# Patient Record
Sex: Male | Born: 1935 | Race: White | Hispanic: No | Marital: Single | Smoking: Never smoker
Health system: Southern US, Community
[De-identification: ages and names within clinical notes are randomized; demographics above are authoritative.]

## PROBLEM LIST (undated history)

## (undated) ENCOUNTER — Emergency Department (HOSPITAL_BASED_OUTPATIENT_CLINIC_OR_DEPARTMENT_OTHER): Admission: EM | Payer: Self-pay | Source: Home / Self Care

## (undated) DIAGNOSIS — I341 Nonrheumatic mitral (valve) prolapse: Secondary | ICD-10-CM

## (undated) DIAGNOSIS — Z923 Personal history of irradiation: Secondary | ICD-10-CM

## (undated) DIAGNOSIS — C801 Malignant (primary) neoplasm, unspecified: Secondary | ICD-10-CM

## (undated) DIAGNOSIS — C61 Malignant neoplasm of prostate: Secondary | ICD-10-CM

## (undated) DIAGNOSIS — H3530 Unspecified macular degeneration: Secondary | ICD-10-CM

## (undated) DIAGNOSIS — N529 Male erectile dysfunction, unspecified: Secondary | ICD-10-CM

## (undated) DIAGNOSIS — C4490 Unspecified malignant neoplasm of skin, unspecified: Secondary | ICD-10-CM

## (undated) HISTORY — DX: Personal history of irradiation: Z92.3

## (undated) HISTORY — PX: OTHER SURGICAL HISTORY: SHX169

## (undated) HISTORY — DX: Nonrheumatic mitral (valve) prolapse: I34.1

## (undated) HISTORY — DX: Malignant (primary) neoplasm, unspecified: C80.1

## (undated) HISTORY — DX: Male erectile dysfunction, unspecified: N52.9

## (undated) HISTORY — DX: Malignant neoplasm of prostate: C61

## (undated) HISTORY — PX: CATARACT EXTRACTION: SUR2

## (undated) HISTORY — PX: TONSILLECTOMY: SUR1361

---

## 2003-06-15 ENCOUNTER — Ambulatory Visit (HOSPITAL_COMMUNITY): Admission: RE | Admit: 2003-06-15 | Discharge: 2003-06-15 | Payer: Self-pay | Admitting: *Deleted

## 2007-12-17 DIAGNOSIS — C61 Malignant neoplasm of prostate: Secondary | ICD-10-CM

## 2007-12-17 HISTORY — DX: Malignant neoplasm of prostate: C61

## 2008-04-17 DIAGNOSIS — C801 Malignant (primary) neoplasm, unspecified: Secondary | ICD-10-CM

## 2008-04-17 HISTORY — DX: Malignant (primary) neoplasm, unspecified: C80.1

## 2010-01-10 ENCOUNTER — Ambulatory Visit: Admission: RE | Admit: 2010-01-10 | Discharge: 2010-02-01 | Payer: Self-pay | Admitting: Radiation Oncology

## 2010-09-02 NOTE — Op Note (Signed)
NAME:  Lucas Navarro, Lucas Navarro                   ACCOUNT NO.:  [id number]   MEDICAL RECORD NO.:  [id number]                   PATIENT TYPE:  AMB   LOCATION:  ENDO                                 FACILITY:  [facility]   PHYSICIAN:  Georgiana Spinner, M.D.                 DATE OF BIRTH:  [birth year]   DATE OF PROCEDURE:  DATE OF DISCHARGE:                                 OPERATIVE REPORT   PROCEDURE:  Colonoscopy.   INDICATION:  Colon cancer screening.   ANESTHESIA:  Demerol 60 mg, Versed 6 mg.   DESCRIPTION OF PROCEDURE:  With the patient mildly sedated in the left  lateral decubitus position, a rectal exam was performed which was  unremarkable.  Subsequently, the Olympus videoscopic colonoscope was  inserted in the rectum and passed under direct vision to the cecum  identified by the ileocecal valve and appendiceal orifice both of which were  photographed.  From this point, the colonoscope was slowly withdrawn, taking  circumferential views of the colonic mucosa, stopping only in the rectum  which appeared normal on direct view and showed hemorrhoids on retroflex  view.  The endoscope was straightened and withdrawn.  The patient's vital  signs and pulse oximetry remained stable, the patient tolerated the  procedure well without apparent complications.   FINDINGS:  Internal hemorrhoids, otherwise unremarkable exam.   PLAN:  Consider repeat examination in 5-10 years.                                               Georgiana Spinner, M.D.    GMO/MEDQ  D:  06/15/2003  T:  06/15/2003  Job:  [id number]

## 2011-09-07 DIAGNOSIS — C61 Malignant neoplasm of prostate: Secondary | ICD-10-CM

## 2011-09-07 HISTORY — DX: Malignant neoplasm of prostate: C61

## 2011-09-20 ENCOUNTER — Encounter: Payer: Self-pay | Admitting: *Deleted

## 2011-09-20 DIAGNOSIS — C801 Malignant (primary) neoplasm, unspecified: Secondary | ICD-10-CM | POA: Insufficient documentation

## 2011-09-21 ENCOUNTER — Encounter: Payer: Self-pay | Admitting: Radiation Oncology

## 2011-09-21 ENCOUNTER — Ambulatory Visit
Admission: RE | Admit: 2011-09-21 | Discharge: 2011-09-21 | Disposition: A | Discharge status: Home / Self Care | Payer: Medicare Other | Source: Ambulatory Visit | Attending: Radiation Oncology | Admitting: Radiation Oncology

## 2011-09-21 VITALS — BP 105/65 | HR 67 | Resp 18 | Ht 73.0 in | Wt 149.9 lb

## 2011-09-21 DIAGNOSIS — C61 Malignant neoplasm of prostate: Secondary | ICD-10-CM | POA: Insufficient documentation

## 2011-09-21 DIAGNOSIS — Z510 Encounter for antineoplastic radiation therapy: Secondary | ICD-10-CM | POA: Insufficient documentation

## 2011-09-21 HISTORY — DX: Unspecified macular degeneration: H35.30

## 2011-09-21 HISTORY — DX: Unspecified malignant neoplasm of skin, unspecified: C44.90

## 2011-09-21 NOTE — Progress Notes (Signed)
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 Su Grand  History: The patient returns today for review and consideration of radiation therapy in the management of his Stage T2 A. intermediate risk adenocarcinoma prostate. I saw the patient in consultation in September of 2011 hours time he presented with Gleason 7 (3+4) disease involving 3 of 12 cores and Gleason 6 (3+3) disease involving 1 of 12 cores. His PSA was 6.5. He was reluctant to consider curative treatment and he has been undergoing active surveillance with Dr. Brunilda Payor. More recently, his PSA rose to 7.3 on 08/09/2011. He underwent repeat ultrasound-guided biopsies on 09/07/2011 was found have Gleason 7 (4+3) involving 5% of one core from right lateral base, 20% of one core from the right base and 30% of one core from the right lateral mid gland. His then had Gleason 7 (3+4) involving 10% of one core from the right mid gland along with Gleason 6 (3+3) involving 20% of one core from left lateral mid gland and 5% of one core from left mid gland. He is doing reasonably well from a GU and GI standpoint. His I PSS score is 6. He does have erectile dysfunction.  Physical examination: Alert and oriented. He appears younger than his stated age of 42  Wt Readings from Last 3 Encounters:  09/21/11 149 lb 14.4 oz (67.994 kg)   Temp Readings from Last 3 Encounters:  No data found for Temp   BP Readings from Last 3 Encounters:  09/21/11 105/65   Pulse Readings from Last 3 Encounters:  09/21/11 67   Head and neck examination grossly unremarkable. Nodes without palpable cervical or supraclavicular adenopathy. Chest lungs clear. Heart rate and rhythm. Abdomen soft without masses organomegaly. Genitalia unremarkable to inspection. Rectal the prostate gland is normal size and there is raised induration along the right base over an area of one to 2 cm. There is no palpable periprostatic  tumor involvement. Extremities without edema. Neurologic examination grossly nonfocal.  Laboratory data:  PSA from 08/09/2011   7.3  Impression: Stage T2a  intermediate risk adenocarcinoma prostate. I explained to the patient that his prognosis is related to his stage, PSA level, and Gleason score. His stage and PSA level are favorable while his Gleason score of 7 is of intermediate favorability. His disease has become more aggressive with a Gleason score of now 4+3, and also palpable disease along his right base. We discussed his management options from radiation therapy standpoint. His options include 5 weeks of external beam radiation therapy followed by seed implantation as a boost or 8 weeks of external beam/IMRT. We also discussed the possibility of short duration androgen deprivation therapy on or off protocol. He is reluctant to undergo androgen deprivation therapy even for 6 months. After lengthy discussion he is most interested in external beam/IMRT alone. We discussed the potential acute and late toxicities of radiation therapy and he wishes to proceed as outlined. I will kindly asked Dr. Brunilda Payor to placed 3 gold seed markers within the prostate for image guidance during his external beam. We'll then get him scheduled for CT simulation/treatment planning.  Plan: As discussed above.  45 minutes was spent face-to-face with the patient, primarily counseling the patient and coordinating his care.

## 2011-09-21 NOTE — Progress Notes (Signed)
Complete PATIENT MEASURE OF DISTRESS with a score of 0 submitted to social work. Also, complete NUTRITION RISK SCREEN worksheet submitted to Zenovia Jarred, RD without concerns.

## 2011-09-21 NOTE — Progress Notes (Signed)
See progress note under physician encounter. 

## 2011-09-21 NOTE — Progress Notes (Signed)
Patient presents to the clinic today for a follow up new consult with Dr. Dayton Scrape reference prostate ca. Patient reports being seen by Dr. Dayton Scrape approximately two years ago reference this same issue. Patient reports PSA stabilized at 5.9 until recently when it rose to 7.3. Pathology notes Gleason score of 7 and spread to other areas of prostate. Patient is alert and oriented to person, place, and time. No distress noted. Steady gait noted. Pleasant affect noted. Patient denies pain at this time. Patient reports on average he voids 0-1 times per night. IPSS of 6 noted. Patient denies burning with urination. Patient denies hematuria. Patient reports BMs are formed and regular. Patient reports only occasional urgency. Patient denies incontinence. Patient reports his weight remains stable. Patient denies nausea, vomiting, headache or dizziness. Patient reports eating and sleeping without difficulty.     NKDA Denies pacemaker No hx of radiation therapy

## 2011-09-22 ENCOUNTER — Telehealth: Payer: Self-pay | Admitting: *Deleted

## 2011-09-22 NOTE — Telephone Encounter (Signed)
CALLED PATIENT TO INFORM OF GOLD SEED PLACEMENT ON 10-26-11 AT 11:15 AM AT DR. Brunilda Payor' OFFICE AND HIS SIM ON 11-06-11 10:00 AM AT DR. MURRAY'S OFFICE,  SPOKE WITH PT. AND HE IS AWARE OF THESE APPTS.

## 2011-09-26 ENCOUNTER — Telehealth: Payer: Self-pay | Admitting: *Deleted

## 2011-09-26 NOTE — Telephone Encounter (Signed)
xxxxx 

## 2011-11-03 ENCOUNTER — Telehealth: Payer: Self-pay | Admitting: *Deleted

## 2011-11-03 NOTE — Telephone Encounter (Signed)
XXXX 

## 2011-11-06 ENCOUNTER — Ambulatory Visit
Admission: RE | Admit: 2011-11-06 | Discharge: 2011-11-06 | Disposition: A | Discharge status: Home / Self Care | Payer: Medicare Other | Source: Ambulatory Visit | Attending: Radiation Oncology | Admitting: Radiation Oncology

## 2011-11-06 DIAGNOSIS — C61 Malignant neoplasm of prostate: Secondary | ICD-10-CM

## 2011-11-06 NOTE — Progress Notes (Signed)
CT simulation/treatment planning:  The patient was taken to the CT simulator. An alpha cradle was constructed for immobilization. A red rubber catheter was placed within the rectal vault. He was then catheterized and contrast instilled into the urethra. He was then scanned. I contoured his prostate and seminal vesicles. I prescribing 7800 cGy in 40 sessions to his prostate PTV which represents the prostate was 0.8 cm except for 0.5 cm along the rectum. I prescribing 5600 cGy in 40 sessions to his seminal vesicles represent a seminal vesicles +0.5 cm. He is now ready for IMRT simulation/treatment planning. He is be treated with a comfortably full bladder and he'll undergo daily MV CT setting up to his 3 gold seeds.

## 2011-11-06 NOTE — Progress Notes (Signed)
Met with patient to discuss RO billing.  Dx: 185 Malignant neoplasm of prostate   Attending Rad: Dr. Dayton Scrape   Rad Tx: [id number] IMRT x 40

## 2011-11-08 ENCOUNTER — Encounter: Payer: Self-pay | Admitting: Radiation Oncology

## 2011-11-08 NOTE — Progress Notes (Signed)
IMRT simulation/treatment planning note: The patient completed IMRT simulation/treatment planning in the management of his carcinoma the prostate. IMRT was chosen to decrease the risk for both acute and late rectal and bladder toxicity compared to conventional or 3-D conformal radiation therapy. Dose volume histograms were obtained for the bladder, rectum, and femoral heads. We met our departmental goals with respect to his avoidance structures. We also met our target goals with respect to coverage of his prostate and seminal vesicles. Please see the electronic medical record for specific dose volume histograms. I requesting daily MV CT for image guidance, setting up to his 3 gold seeds. He is being treated with IMRT/Tomotherapy he'll receive a dose of 7800 cGy 40 sessions to his prostate PTV which represents the prostate +0.8 cm except for 0.5 cm along the rectum. He will receive 5600 cGy in 40 sessions to his seminal vesicle PTV which are presents his seminal vesicles was 0.5 cm.

## 2011-11-15 ENCOUNTER — Ambulatory Visit
Admission: RE | Admit: 2011-11-15 | Discharge: 2011-11-15 | Disposition: A | Discharge status: Home / Self Care | Payer: Medicare Other | Source: Ambulatory Visit | Attending: Radiation Oncology | Admitting: Radiation Oncology

## 2011-11-15 DIAGNOSIS — C61 Malignant neoplasm of prostate: Secondary | ICD-10-CM

## 2011-11-15 NOTE — Progress Notes (Signed)
Post sim ed completed w/pt; gave pt "Radiation and You" booklet w/pertinent pages marked. All questions answered; pt verbalized teachback correctly.

## 2011-11-15 NOTE — Progress Notes (Signed)
Chart note: [name] underwent Tomotherapy segmentation in the management of his carcinoma the prostate. He is being treated to 4.9 delivered field widths corresponding to one set of IMRT treatment devices [phone]).

## 2011-11-16 ENCOUNTER — Ambulatory Visit
Admission: RE | Admit: 2011-11-16 | Discharge: 2011-11-16 | Disposition: A | Discharge status: Home / Self Care | Payer: Medicare Other | Source: Ambulatory Visit | Attending: Radiation Oncology | Admitting: Radiation Oncology

## 2011-11-17 ENCOUNTER — Ambulatory Visit
Admission: RE | Admit: 2011-11-17 | Discharge: 2011-11-17 | Disposition: A | Discharge status: Home / Self Care | Payer: Medicare Other | Source: Ambulatory Visit | Attending: Radiation Oncology | Admitting: Radiation Oncology

## 2011-11-20 ENCOUNTER — Ambulatory Visit
Admission: RE | Admit: 2011-11-20 | Discharge: 2011-11-20 | Disposition: A | Discharge status: Home / Self Care | Payer: Medicare Other | Source: Ambulatory Visit | Attending: Radiation Oncology | Admitting: Radiation Oncology

## 2011-11-20 ENCOUNTER — Encounter: Payer: Self-pay | Admitting: Radiation Oncology

## 2011-11-20 VITALS — BP 116/72 | HR 62 | Temp 97.7°F | Resp 20 | Wt 148.5 lb

## 2011-11-20 DIAGNOSIS — C61 Malignant neoplasm of prostate: Secondary | ICD-10-CM

## 2011-11-20 NOTE — Progress Notes (Signed)
Pt denies pain, loss of appetite, fatigue, urinary, bowel issues.

## 2011-11-20 NOTE — Progress Notes (Signed)
Weekly Management Note:  Site:Prostate Current Dose:  780  cGy Projected Dose: 7800  cGy  Narrative: The patient is seen today for routine under treatment assessment. CBCT/MVCT images/port films were reviewed. The chart was reviewed.   Satisfactory bladder filling. No GU or GI difficulties.  Physical Examination:  Filed Vitals:   11/20/11 1007  BP: 116/72  Pulse: 62  Temp: 97.7 F (36.5 C)  Resp: 20  .  Weight: 148 lb 8 oz (67.359 kg). No change.  Impression: Tolerating radiation therapy well.  Plan: Continue radiation therapy as planned.

## 2011-11-21 ENCOUNTER — Ambulatory Visit
Admission: RE | Admit: 2011-11-21 | Discharge: 2011-11-21 | Disposition: A | Discharge status: Home / Self Care | Payer: Medicare Other | Source: Ambulatory Visit | Attending: Radiation Oncology | Admitting: Radiation Oncology

## 2011-11-22 ENCOUNTER — Ambulatory Visit
Admission: RE | Admit: 2011-11-22 | Discharge: 2011-11-22 | Disposition: A | Discharge status: Home / Self Care | Payer: Medicare Other | Source: Ambulatory Visit | Attending: Radiation Oncology | Admitting: Radiation Oncology

## 2011-11-23 ENCOUNTER — Ambulatory Visit: Payer: Medicare Other

## 2011-11-24 ENCOUNTER — Ambulatory Visit
Admission: RE | Admit: 2011-11-24 | Discharge: 2011-11-24 | Disposition: A | Discharge status: Home / Self Care | Payer: Medicare Other | Source: Ambulatory Visit | Attending: Radiation Oncology | Admitting: Radiation Oncology

## 2011-11-27 ENCOUNTER — Ambulatory Visit
Admission: RE | Admit: 2011-11-27 | Discharge: 2011-11-27 | Disposition: A | Discharge status: Home / Self Care | Payer: Medicare Other | Source: Ambulatory Visit | Attending: Radiation Oncology | Admitting: Radiation Oncology

## 2011-11-27 ENCOUNTER — Encounter: Payer: Self-pay | Admitting: Radiation Oncology

## 2011-11-27 VITALS — BP 105/64 | HR 66 | Temp 97.0°F | Resp 20 | Wt 148.3 lb

## 2011-11-27 DIAGNOSIS — C61 Malignant neoplasm of prostate: Secondary | ICD-10-CM

## 2011-11-27 NOTE — Progress Notes (Signed)
Pt denies pain, fatigue, loss of appetite, issues w/bowel or bladder.

## 2011-11-27 NOTE — Progress Notes (Signed)
   Weekly Management Note, prostate cancer Current Dose:  15.6 Gy  Projected Dose:  78 Gy   Narrative:  The patient presents for routine under treatment assessment.  CBCT/MVCT images/Port film x-rays were reviewed.  The chart was checked. He is doing well. He denies any bowel or bladder changes thus far. He has nocturia once per night which is stable.  Physical Findings:  weight is 148 lb 4.8 oz (67.268 kg). His oral temperature is 97 F (36.1 C). His blood pressure is 105/64 and his pulse is 66. His respiration is 20.  he is well-appearing in no acute distress  Impression:  The patient is tolerating radiotherapy.  Plan:  Continue radiotherapy as planned.  ________________________________   Lonie Peak, M.D.

## 2011-11-28 ENCOUNTER — Ambulatory Visit
Admission: RE | Admit: 2011-11-28 | Discharge: 2011-11-28 | Disposition: A | Discharge status: Home / Self Care | Payer: Medicare Other | Source: Ambulatory Visit | Attending: Radiation Oncology | Admitting: Radiation Oncology

## 2011-11-29 ENCOUNTER — Ambulatory Visit
Admission: RE | Admit: 2011-11-29 | Discharge: 2011-11-29 | Disposition: A | Discharge status: Home / Self Care | Payer: Medicare Other | Source: Ambulatory Visit | Attending: Radiation Oncology | Admitting: Radiation Oncology

## 2011-11-30 ENCOUNTER — Ambulatory Visit
Admission: RE | Admit: 2011-11-30 | Discharge: 2011-11-30 | Disposition: A | Discharge status: Home / Self Care | Payer: Medicare Other | Source: Ambulatory Visit | Attending: Radiation Oncology | Admitting: Radiation Oncology

## 2011-12-01 ENCOUNTER — Ambulatory Visit
Admission: RE | Admit: 2011-12-01 | Discharge: 2011-12-01 | Disposition: A | Discharge status: Home / Self Care | Payer: Medicare Other | Source: Ambulatory Visit | Attending: Radiation Oncology | Admitting: Radiation Oncology

## 2011-12-04 ENCOUNTER — Ambulatory Visit
Admission: RE | Admit: 2011-12-04 | Discharge: 2011-12-04 | Disposition: A | Discharge status: Home / Self Care | Payer: Medicare Other | Source: Ambulatory Visit | Attending: Radiation Oncology | Admitting: Radiation Oncology

## 2011-12-04 VITALS — BP 116/72 | HR 61 | Temp 97.4°F | Wt 148.8 lb

## 2011-12-04 DIAGNOSIS — C61 Malignant neoplasm of prostate: Secondary | ICD-10-CM

## 2011-12-04 NOTE — Progress Notes (Signed)
Slight", intermittent burning upon urination.  Last Thursday, noted nocturia times with difficulty starting stream and incomplete emptying, but now nocturia ~ twice nightly and emptying better during the day, but notes more difficulty staring stream and emptying.  Denies any hematuria, proctitis, loose stools or fatigue.

## 2011-12-04 NOTE — Progress Notes (Signed)
Weekly Management Note:  Site:Prostate  Current Dose:  2535  cGy Projected Dose: 7800  cGy  Narrative: The patient is seen today for routine under treatment assessment. CBCT/MVCT images/port films were reviewed. The chart was reviewed.   Bladder filling appears to be satisfactory. Last Thursday he had nocturia x4 but otherwise he has been averaging one to 3 times a night. He does drink iced tea in the evening. No significant dysuria. He does have occasional hesitancy as well. No GI problems.  Physical Examination:  Filed Vitals:   12/04/11 0950  BP: 116/72  Pulse: 61  Temp: 97.4 F (36.3 C)  .  Weight: 148 lb 12.8 oz (67.495 kg). No change.  Impression: Tolerating radiation therapy well.  Plan: Continue radiation therapy as planned.

## 2011-12-05 ENCOUNTER — Ambulatory Visit
Admission: RE | Admit: 2011-12-05 | Discharge: 2011-12-05 | Disposition: A | Discharge status: Home / Self Care | Payer: Medicare Other | Source: Ambulatory Visit | Attending: Radiation Oncology | Admitting: Radiation Oncology

## 2011-12-06 ENCOUNTER — Ambulatory Visit
Admission: RE | Admit: 2011-12-06 | Discharge: 2011-12-06 | Disposition: A | Discharge status: Home / Self Care | Payer: Medicare Other | Source: Ambulatory Visit | Attending: Radiation Oncology | Admitting: Radiation Oncology

## 2011-12-07 ENCOUNTER — Ambulatory Visit
Admission: RE | Admit: 2011-12-07 | Discharge: 2011-12-07 | Disposition: A | Discharge status: Home / Self Care | Payer: Medicare Other | Source: Ambulatory Visit | Attending: Radiation Oncology | Admitting: Radiation Oncology

## 2011-12-08 ENCOUNTER — Ambulatory Visit
Admission: RE | Admit: 2011-12-08 | Discharge: 2011-12-08 | Disposition: A | Discharge status: Home / Self Care | Payer: Medicare Other | Source: Ambulatory Visit | Attending: Radiation Oncology | Admitting: Radiation Oncology

## 2011-12-11 ENCOUNTER — Ambulatory Visit
Admission: RE | Admit: 2011-12-11 | Discharge: 2011-12-11 | Disposition: A | Discharge status: Home / Self Care | Payer: Medicare Other | Source: Ambulatory Visit | Attending: Radiation Oncology | Admitting: Radiation Oncology

## 2011-12-11 ENCOUNTER — Encounter: Payer: Self-pay | Admitting: Radiation Oncology

## 2011-12-11 VITALS — BP 112/69 | HR 64 | Temp 97.8°F | Resp 20 | Wt 148.1 lb

## 2011-12-11 DIAGNOSIS — C61 Malignant neoplasm of prostate: Secondary | ICD-10-CM

## 2011-12-11 NOTE — Progress Notes (Signed)
Pt reports nocturia x 2-3, dysuria which lingers after he completes voiding, constipation which he took Miralax for w/good results, "pain, pressure" in mid low abdomen which goes away w/movement.  Denies fatigue, loss of appetite.

## 2011-12-11 NOTE — Progress Notes (Signed)
Weekly Management Note:  Site:Prostate Current Dose:  3510  cGy Projected Dose: 7800  cGy  Narrative: The patient is seen today for routine under treatment assessment. CBCT/MVCT images/port films were reviewed. The chart was reviewed.   Bladder filling is satisfactory. He does report nocturia x2-3 along with slight dysuria. He is able to have erections but has difficulty achieving and orgasm secondary to pain.  Physical Examination:  Filed Vitals:   12/11/11 0957  BP: 112/69  Pulse: 64  Temp: 97.8 F (36.6 C)  Resp: 20  .  Weight: 148 lb 1.6 oz (67.178 kg). No change.  Impression: Tolerating radiation therapy well. However, he appears to have mild radiation urethritis/cystitis as expected. He probably has some thickening of his prostatic/seminal vesicle secretions resulting in pain, preventing orgasm.  Plan: Continue radiation therapy as planned. I suggested he take Aleve to 3 times a day.

## 2011-12-12 ENCOUNTER — Ambulatory Visit
Admission: RE | Admit: 2011-12-12 | Discharge: 2011-12-12 | Disposition: A | Discharge status: Home / Self Care | Payer: Medicare Other | Source: Ambulatory Visit | Attending: Radiation Oncology | Admitting: Radiation Oncology

## 2011-12-13 ENCOUNTER — Ambulatory Visit
Admission: RE | Admit: 2011-12-13 | Discharge: 2011-12-13 | Disposition: A | Discharge status: Home / Self Care | Payer: Medicare Other | Source: Ambulatory Visit | Attending: Radiation Oncology | Admitting: Radiation Oncology

## 2011-12-14 ENCOUNTER — Ambulatory Visit
Admission: RE | Admit: 2011-12-14 | Discharge: 2011-12-14 | Disposition: A | Discharge status: Home / Self Care | Payer: Medicare Other | Source: Ambulatory Visit | Attending: Radiation Oncology | Admitting: Radiation Oncology

## 2011-12-15 ENCOUNTER — Ambulatory Visit
Admission: RE | Admit: 2011-12-15 | Discharge: 2011-12-15 | Disposition: A | Discharge status: Home / Self Care | Payer: Medicare Other | Source: Ambulatory Visit | Attending: Radiation Oncology | Admitting: Radiation Oncology

## 2011-12-19 ENCOUNTER — Ambulatory Visit
Admission: RE | Admit: 2011-12-19 | Discharge: 2011-12-19 | Disposition: A | Discharge status: Home / Self Care | Payer: Medicare Other | Source: Ambulatory Visit | Attending: Radiation Oncology | Admitting: Radiation Oncology

## 2011-12-19 ENCOUNTER — Encounter: Payer: Self-pay | Admitting: Radiation Oncology

## 2011-12-19 VITALS — BP 107/59 | HR 69 | Temp 97.8°F | Resp 20 | Wt 150.1 lb

## 2011-12-19 DIAGNOSIS — C61 Malignant neoplasm of prostate: Secondary | ICD-10-CM

## 2011-12-19 NOTE — Progress Notes (Signed)
Weekly Management Note:  Site:Prostate Current Dose:  4485  cGy Projected Dose: 7800  cGy  Narrative: The patient is seen today for routine under treatment assessment. CBCT/MVCT images/port films were reviewed. The chart was reviewed.   Bladder filling appears to be satisfactory. Less dysuria while taking Aleve. He also takes MiraLax when necessary for constipation.  Physical Examination:  Filed Vitals:   12/19/11 1008  BP: 107/59  Pulse: 69  Temp: 97.8 F (36.6 C)  Resp: 20  .  Weight: 150 lb 1.6 oz (68.085 kg). No change.  Impression: Tolerating radiation therapy well.  Plan: Continue radiation therapy as planned.

## 2011-12-19 NOTE — Progress Notes (Signed)
Patient alert,oriented x3, here for weekly rad txs, 23/40 completed, still has nocturia x2, slight dysuria aleve helped states patient, normal bowel movements, takes miralax when constipated 10:11 AM

## 2011-12-20 ENCOUNTER — Ambulatory Visit
Admission: RE | Admit: 2011-12-20 | Discharge: 2011-12-20 | Disposition: A | Discharge status: Home / Self Care | Payer: Medicare Other | Source: Ambulatory Visit | Attending: Radiation Oncology | Admitting: Radiation Oncology

## 2011-12-20 DIAGNOSIS — C61 Malignant neoplasm of prostate: Secondary | ICD-10-CM | POA: Insufficient documentation

## 2011-12-20 DIAGNOSIS — Z510 Encounter for antineoplastic radiation therapy: Secondary | ICD-10-CM | POA: Insufficient documentation

## 2011-12-21 ENCOUNTER — Ambulatory Visit
Admission: RE | Admit: 2011-12-21 | Discharge: 2011-12-21 | Disposition: A | Discharge status: Home / Self Care | Payer: Medicare Other | Source: Ambulatory Visit | Attending: Radiation Oncology | Admitting: Radiation Oncology

## 2011-12-22 ENCOUNTER — Ambulatory Visit
Admission: RE | Admit: 2011-12-22 | Discharge: 2011-12-22 | Disposition: A | Discharge status: Home / Self Care | Payer: Medicare Other | Source: Ambulatory Visit | Attending: Radiation Oncology | Admitting: Radiation Oncology

## 2011-12-25 ENCOUNTER — Encounter: Payer: Self-pay | Admitting: Radiation Oncology

## 2011-12-25 ENCOUNTER — Ambulatory Visit
Admission: RE | Admit: 2011-12-25 | Discharge: 2011-12-25 | Disposition: A | Discharge status: Home / Self Care | Payer: Medicare Other | Source: Ambulatory Visit | Attending: Radiation Oncology | Admitting: Radiation Oncology

## 2011-12-25 VITALS — BP 105/54 | HR 65 | Temp 97.7°F | Resp 20 | Wt 148.7 lb

## 2011-12-25 DIAGNOSIS — C61 Malignant neoplasm of prostate: Secondary | ICD-10-CM

## 2011-12-25 NOTE — Progress Notes (Signed)
Weekly Management Note:  Site:Prostate Current Dose:  5265  cGy Projected Dose: 7800  cGy  Narrative: The patient is seen today for routine under treatment assessment. CBCT/MVCT images/port films were reviewed. The chart was reviewed.  Bladder filling appears to be satisfactory. No new GU or GI difficulties.  Physical Examination:  Filed Vitals:   12/25/11 0916  BP: 105/54  Pulse: 65  Temp: 97.7 F (36.5 C)  Resp: 20  .  Weight: 148 lb 11.2 oz (67.45 kg). No change .  Impression: Tolerating radiation therapy well.  Plan: Continue radiation therapy as planned.

## 2011-12-25 NOTE — Progress Notes (Signed)
Patient  Alert,oriented x3, 27/40 rad txs completed, taking aleve prn for slight dysuria, softer stools, constipation gone, not taking miralax,  Doing well 9:18 AM

## 2011-12-26 ENCOUNTER — Ambulatory Visit
Admission: RE | Admit: 2011-12-26 | Discharge: 2011-12-26 | Disposition: A | Discharge status: Home / Self Care | Payer: Medicare Other | Source: Ambulatory Visit | Attending: Radiation Oncology | Admitting: Radiation Oncology

## 2011-12-27 ENCOUNTER — Ambulatory Visit
Admission: RE | Admit: 2011-12-27 | Discharge: 2011-12-27 | Disposition: A | Discharge status: Home / Self Care | Payer: Medicare Other | Source: Ambulatory Visit | Attending: Radiation Oncology | Admitting: Radiation Oncology

## 2011-12-28 ENCOUNTER — Ambulatory Visit
Admission: RE | Admit: 2011-12-28 | Discharge: 2011-12-28 | Disposition: A | Discharge status: Home / Self Care | Payer: Medicare Other | Source: Ambulatory Visit | Attending: Radiation Oncology | Admitting: Radiation Oncology

## 2011-12-29 ENCOUNTER — Ambulatory Visit
Admission: RE | Admit: 2011-12-29 | Discharge: 2011-12-29 | Disposition: A | Discharge status: Home / Self Care | Payer: Medicare Other | Source: Ambulatory Visit | Attending: Radiation Oncology | Admitting: Radiation Oncology

## 2012-01-01 ENCOUNTER — Ambulatory Visit
Admission: RE | Admit: 2012-01-01 | Discharge: 2012-01-01 | Disposition: A | Discharge status: Home / Self Care | Payer: Medicare Other | Source: Ambulatory Visit | Attending: Radiation Oncology | Admitting: Radiation Oncology

## 2012-01-01 ENCOUNTER — Encounter: Payer: Self-pay | Admitting: Radiation Oncology

## 2012-01-01 VITALS — BP 112/57 | HR 65 | Temp 97.8°F | Resp 20 | Wt 146.6 lb

## 2012-01-01 DIAGNOSIS — C61 Malignant neoplasm of prostate: Secondary | ICD-10-CM

## 2012-01-01 NOTE — Progress Notes (Signed)
Here wekly rad tx prostate, 32/40 completed, alert,oriented x3, takes aleve prn, not taking in a while, No pain, no dysuria, bowels okay, sleeping well,  Only got up x1 last night 9:43 AM

## 2012-01-01 NOTE — Progress Notes (Signed)
Weekly Management Note:  Site:Prostate Current Dose:  6240  cGy Projected Dose: 7800  cGy  Narrative: The patient is seen today for routine under treatment assessment. CBCT/MVCT images/port films were reviewed. The chart was reviewed.   Bladder filling is satisfactory. Doing well from a GU and GI standpoint.  Physical Examination:  Filed Vitals:   01/01/12 0943  BP: 112/57  Pulse: 65  Temp: 97.8 F (36.6 C)  Resp: 20  .  Weight: 146 lb 9.6 oz (66.497 kg). No change .  Impression: Tolerating radiation therapy well.  Plan: Continue radiation therapy as planned.

## 2012-01-02 ENCOUNTER — Ambulatory Visit
Admission: RE | Admit: 2012-01-02 | Discharge: 2012-01-02 | Disposition: A | Discharge status: Home / Self Care | Payer: Medicare Other | Source: Ambulatory Visit | Attending: Radiation Oncology | Admitting: Radiation Oncology

## 2012-01-03 ENCOUNTER — Ambulatory Visit
Admission: RE | Admit: 2012-01-03 | Discharge: 2012-01-03 | Disposition: A | Discharge status: Home / Self Care | Payer: Medicare Other | Source: Ambulatory Visit | Attending: Radiation Oncology | Admitting: Radiation Oncology

## 2012-01-04 ENCOUNTER — Ambulatory Visit
Admission: RE | Admit: 2012-01-04 | Discharge: 2012-01-04 | Disposition: A | Discharge status: Home / Self Care | Payer: Medicare Other | Source: Ambulatory Visit | Attending: Radiation Oncology | Admitting: Radiation Oncology

## 2012-01-05 ENCOUNTER — Ambulatory Visit
Admission: RE | Admit: 2012-01-05 | Discharge: 2012-01-05 | Disposition: A | Discharge status: Home / Self Care | Payer: Medicare Other | Source: Ambulatory Visit | Attending: Radiation Oncology | Admitting: Radiation Oncology

## 2012-01-08 ENCOUNTER — Ambulatory Visit
Admission: RE | Admit: 2012-01-08 | Discharge: 2012-01-08 | Disposition: A | Discharge status: Home / Self Care | Payer: Medicare Other | Source: Ambulatory Visit | Attending: Radiation Oncology | Admitting: Radiation Oncology

## 2012-01-08 ENCOUNTER — Encounter: Payer: Self-pay | Admitting: Radiation Oncology

## 2012-01-08 VITALS — BP 114/63 | HR 67 | Temp 97.3°F | Resp 20 | Wt 148.0 lb

## 2012-01-08 DIAGNOSIS — C61 Malignant neoplasm of prostate: Secondary | ICD-10-CM

## 2012-01-08 NOTE — Progress Notes (Signed)
Weekly Management Note:  Site:Prostate Current Dose:  7215  cGy Projected Dose: 7800  cGy  Narrative: The patient is seen today for routine under treatment assessment. CBCT/MVCT images/port films were reviewed. The chart was reviewed.   He at some loosening of his bowels over the weekend. Otherwise, doing well from a GU and GI standpoint. He appears to have satisfactory bladder filling today.  Physical Examination:  Filed Vitals:   01/08/12 0919  BP: 114/63  Pulse: 67  Temp: 97.3 F (36.3 C)  Resp: 20  .  Weight: 148 lb (67.132 kg). No change.  Impression: Tolerating radiation therapy well.  Plan: Continue radiation therapy as planned. He'll finish his radiation therapy this Thursday and return to see me for a followup visit in one month. An appointment cone was given to him today.

## 2012-01-08 NOTE — Progress Notes (Signed)
Patient here weekly rad tx, 37/40, had cramping lower abdominal over the weekend, bowel movements close to diarrhea stated patient, today is better, no dysuria when voiding,  9:22 AM

## 2012-01-09 ENCOUNTER — Ambulatory Visit
Admission: RE | Admit: 2012-01-09 | Discharge: 2012-01-09 | Disposition: A | Discharge status: Home / Self Care | Payer: Medicare Other | Source: Ambulatory Visit | Attending: Radiation Oncology | Admitting: Radiation Oncology

## 2012-01-10 ENCOUNTER — Ambulatory Visit: Payer: Medicare Other

## 2012-01-11 ENCOUNTER — Ambulatory Visit
Admission: RE | Admit: 2012-01-11 | Discharge: 2012-01-11 | Disposition: A | Discharge status: Home / Self Care | Payer: Medicare Other | Source: Ambulatory Visit | Attending: Radiation Oncology | Admitting: Radiation Oncology

## 2012-01-12 ENCOUNTER — Ambulatory Visit
Admission: RE | Admit: 2012-01-12 | Discharge: 2012-01-12 | Disposition: A | Discharge status: Home / Self Care | Payer: Medicare Other | Source: Ambulatory Visit | Attending: Radiation Oncology | Admitting: Radiation Oncology

## 2012-01-14 ENCOUNTER — Encounter: Payer: Self-pay | Admitting: Radiation Oncology

## 2012-01-14 NOTE — Progress Notes (Signed)
[facility] Radiation Oncology End of Treatment Note  Name:Lucas Navarro  Date: 01/14/2012 ZOX:[id number] DOB:[birth year]   Status:outpatient    CC: Juline Patch, MD  Dr. Su Grand  REFERRING PHYSICIAN:   Dr. Su Grand   DIAGNOSIS:  Stage TI C. intermediate risk adenocarcinoma prostate  INDICATION FOR TREATMENT: Curative   TREATMENT DATES: 11/15/2011 through 01/12/2012                          SITE/DOSE:  Prostate 7800 cGy, seminal vesicles 5600 cGy/40 sessions                          BEAMS/ENERGY:     6 MV photons helical IMRT Tomotherapy. Daily MV CT for image guidance setting up to his 3 gold seeds.            NARRATIVE:   The patient tolerated treatment well with no significant GU or GI toxicity by completion of therapy.                         PLAN: Routine followup in one month. Patient instructed to call if questions or worsening complaints in interim.

## 2012-02-02 ENCOUNTER — Encounter: Payer: Self-pay | Admitting: Radiation Oncology

## 2012-02-07 ENCOUNTER — Ambulatory Visit: Payer: Medicare Other | Admitting: Radiation Oncology

## 2012-02-13 ENCOUNTER — Encounter: Payer: Self-pay | Admitting: Radiation Oncology

## 2012-02-13 ENCOUNTER — Ambulatory Visit
Admission: RE | Admit: 2012-02-13 | Discharge: 2012-02-13 | Disposition: A | Discharge status: Home / Self Care | Payer: Medicare Other | Source: Ambulatory Visit | Attending: Radiation Oncology | Admitting: Radiation Oncology

## 2012-02-13 VITALS — BP 128/66 | HR 66 | Temp 97.6°F | Resp 20 | Wt 145.8 lb

## 2012-02-13 DIAGNOSIS — C61 Malignant neoplasm of prostate: Secondary | ICD-10-CM

## 2012-02-13 NOTE — Progress Notes (Signed)
Followup note:  Lucas Navarro returns today approximately 1 month following completion of external beam/IMRT in the management of his stage TI C. intermediate risk adenocarcinoma prostate. He is doing well from a GU and GI standpoint, however he has had a few episodes of minor rectal bleeding related to constipation. No rectal pain. He plans to resume his MiraLax. No significant GU difficulties. He has nocturia x0-2. He'll see Dr. Brunilda Payor for a followup visit in January of 2014.  Physical examination: Alert and oriented. Wt Readings from Last 3 Encounters:  02/13/12 145 lb 12.8 oz (66.134 kg)  01/08/12 148 lb (67.132 kg)  01/01/12 146 lb 9.6 oz (66.497 kg)   Temp Readings from Last 3 Encounters:  02/13/12 97.6 F (36.4 C) Oral  01/08/12 97.3 F (36.3 C)   01/01/12 97.8 F (36.6 C) Oral   BP Readings from Last 3 Encounters:  02/13/12 128/66  01/08/12 114/63  01/01/12 112/57   Pulse Readings from Last 3 Encounters:  02/13/12 66  01/08/12 67  01/01/12 65   Examination not performed today.  Impression: Satisfactory progress. 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 He is to start MiraLax.  Plan: As discussed above. He'll maintain his followup with Dr. Brunilda Payor in January of 2014. I've not scheduled the patient for a formal followup visit with me and I asked Dr. Brunilda Payor keep me posted on his progress.

## 2012-02-13 NOTE — Progress Notes (Signed)
patient here f/u rad txs prostate:11/15/11-01/12/12 Alert,oriente x3. Occasional blood when passing stools, not every day, no dysuria, or frequency, sometimes gets up at night x1 to void States he has an appt in January 2014 with [facility] Last PSA 08/09/11

## 2012-02-20 ENCOUNTER — Ambulatory Visit: Payer: Medicare Other | Admitting: Radiation Oncology

## 2012-06-01 ENCOUNTER — Other Ambulatory Visit: Payer: Self-pay

## 2012-11-20 ENCOUNTER — Other Ambulatory Visit: Payer: Self-pay

## 2013-02-20 ENCOUNTER — Other Ambulatory Visit: Payer: Self-pay

## 2014-05-13 DIAGNOSIS — C61 Malignant neoplasm of prostate: Secondary | ICD-10-CM | POA: Diagnosis not present

## 2014-05-20 DIAGNOSIS — C61 Malignant neoplasm of prostate: Secondary | ICD-10-CM | POA: Diagnosis not present

## 2014-06-12 DIAGNOSIS — L821 Other seborrheic keratosis: Secondary | ICD-10-CM | POA: Diagnosis not present

## 2014-06-12 DIAGNOSIS — D1801 Hemangioma of skin and subcutaneous tissue: Secondary | ICD-10-CM | POA: Diagnosis not present

## 2014-06-12 DIAGNOSIS — L308 Other specified dermatitis: Secondary | ICD-10-CM | POA: Diagnosis not present

## 2014-06-12 DIAGNOSIS — Z85820 Personal history of malignant melanoma of skin: Secondary | ICD-10-CM | POA: Diagnosis not present

## 2014-06-15 DIAGNOSIS — H3531 Nonexudative age-related macular degeneration: Secondary | ICD-10-CM | POA: Diagnosis not present

## 2014-06-15 DIAGNOSIS — H52203 Unspecified astigmatism, bilateral: Secondary | ICD-10-CM | POA: Diagnosis not present

## 2014-06-15 DIAGNOSIS — Z961 Presence of intraocular lens: Secondary | ICD-10-CM | POA: Diagnosis not present

## 2014-06-15 DIAGNOSIS — H5310 Unspecified subjective visual disturbances: Secondary | ICD-10-CM | POA: Diagnosis not present

## 2014-06-22 DIAGNOSIS — C61 Malignant neoplasm of prostate: Secondary | ICD-10-CM | POA: Diagnosis not present

## 2014-06-22 DIAGNOSIS — Z0000 Encounter for general adult medical examination without abnormal findings: Secondary | ICD-10-CM | POA: Diagnosis not present

## 2014-06-22 DIAGNOSIS — D649 Anemia, unspecified: Secondary | ICD-10-CM | POA: Diagnosis not present

## 2014-06-22 DIAGNOSIS — E780 Pure hypercholesterolemia: Secondary | ICD-10-CM | POA: Diagnosis not present

## 2014-06-24 DIAGNOSIS — Z1389 Encounter for screening for other disorder: Secondary | ICD-10-CM | POA: Diagnosis not present

## 2014-06-24 DIAGNOSIS — Z0001 Encounter for general adult medical examination with abnormal findings: Secondary | ICD-10-CM | POA: Diagnosis not present

## 2014-06-24 DIAGNOSIS — Z23 Encounter for immunization: Secondary | ICD-10-CM | POA: Diagnosis not present

## 2014-07-14 DIAGNOSIS — H3531 Nonexudative age-related macular degeneration: Secondary | ICD-10-CM | POA: Diagnosis not present

## 2014-07-14 DIAGNOSIS — H43812 Vitreous degeneration, left eye: Secondary | ICD-10-CM | POA: Diagnosis not present

## 2014-11-11 DIAGNOSIS — C61 Malignant neoplasm of prostate: Secondary | ICD-10-CM | POA: Diagnosis not present

## 2014-11-18 DIAGNOSIS — C61 Malignant neoplasm of prostate: Secondary | ICD-10-CM | POA: Diagnosis not present

## 2015-01-05 DIAGNOSIS — H3531 Nonexudative age-related macular degeneration: Secondary | ICD-10-CM | POA: Diagnosis not present

## 2015-01-05 DIAGNOSIS — H43812 Vitreous degeneration, left eye: Secondary | ICD-10-CM | POA: Diagnosis not present

## 2015-02-17 DIAGNOSIS — I351 Nonrheumatic aortic (valve) insufficiency: Secondary | ICD-10-CM | POA: Diagnosis not present

## 2015-03-09 DIAGNOSIS — I351 Nonrheumatic aortic (valve) insufficiency: Secondary | ICD-10-CM | POA: Diagnosis not present

## 2015-06-18 DIAGNOSIS — H532 Diplopia: Secondary | ICD-10-CM | POA: Diagnosis not present

## 2015-06-18 DIAGNOSIS — H353121 Nonexudative age-related macular degeneration, left eye, early dry stage: Secondary | ICD-10-CM | POA: Diagnosis not present

## 2015-06-18 DIAGNOSIS — H43813 Vitreous degeneration, bilateral: Secondary | ICD-10-CM | POA: Diagnosis not present

## 2015-06-18 DIAGNOSIS — Z0100 Encounter for examination of eyes and vision without abnormal findings: Secondary | ICD-10-CM | POA: Diagnosis not present

## 2016-01-20 DIAGNOSIS — C61 Malignant neoplasm of prostate: Secondary | ICD-10-CM | POA: Diagnosis not present

## 2016-01-26 DIAGNOSIS — H353111 Nonexudative age-related macular degeneration, right eye, early dry stage: Secondary | ICD-10-CM | POA: Diagnosis not present

## 2016-01-26 DIAGNOSIS — H353121 Nonexudative age-related macular degeneration, left eye, early dry stage: Secondary | ICD-10-CM | POA: Diagnosis not present

## 2016-01-27 DIAGNOSIS — C61 Malignant neoplasm of prostate: Secondary | ICD-10-CM | POA: Diagnosis not present

## 2016-06-16 DIAGNOSIS — L821 Other seborrheic keratosis: Secondary | ICD-10-CM | POA: Diagnosis not present

## 2016-06-16 DIAGNOSIS — D1801 Hemangioma of skin and subcutaneous tissue: Secondary | ICD-10-CM | POA: Diagnosis not present

## 2016-06-16 DIAGNOSIS — L853 Xerosis cutis: Secondary | ICD-10-CM | POA: Diagnosis not present

## 2016-06-19 DIAGNOSIS — H52203 Unspecified astigmatism, bilateral: Secondary | ICD-10-CM | POA: Diagnosis not present

## 2016-06-19 DIAGNOSIS — Z961 Presence of intraocular lens: Secondary | ICD-10-CM | POA: Diagnosis not present

## 2016-06-19 DIAGNOSIS — H43813 Vitreous degeneration, bilateral: Secondary | ICD-10-CM | POA: Diagnosis not present

## 2016-06-19 DIAGNOSIS — H353122 Nonexudative age-related macular degeneration, left eye, intermediate dry stage: Secondary | ICD-10-CM | POA: Diagnosis not present

## 2016-07-26 DIAGNOSIS — Z0000 Encounter for general adult medical examination without abnormal findings: Secondary | ICD-10-CM | POA: Diagnosis not present

## 2016-07-26 DIAGNOSIS — Z125 Encounter for screening for malignant neoplasm of prostate: Secondary | ICD-10-CM | POA: Diagnosis not present

## 2016-07-26 DIAGNOSIS — E441 Mild protein-calorie malnutrition: Secondary | ICD-10-CM | POA: Diagnosis not present

## 2016-07-26 DIAGNOSIS — E7800 Pure hypercholesterolemia, unspecified: Secondary | ICD-10-CM | POA: Diagnosis not present

## 2016-07-26 DIAGNOSIS — N183 Chronic kidney disease, stage 3 (moderate): Secondary | ICD-10-CM | POA: Diagnosis not present

## 2016-08-02 DIAGNOSIS — Z0000 Encounter for general adult medical examination without abnormal findings: Secondary | ICD-10-CM | POA: Diagnosis not present

## 2016-10-25 DIAGNOSIS — H353132 Nonexudative age-related macular degeneration, bilateral, intermediate dry stage: Secondary | ICD-10-CM | POA: Diagnosis not present

## 2017-02-07 DIAGNOSIS — C61 Malignant neoplasm of prostate: Secondary | ICD-10-CM | POA: Diagnosis not present

## 2017-02-13 DIAGNOSIS — C61 Malignant neoplasm of prostate: Secondary | ICD-10-CM | POA: Diagnosis not present

## 2017-02-14 ENCOUNTER — Other Ambulatory Visit: Payer: Self-pay | Admitting: Urology

## 2017-02-14 DIAGNOSIS — C61 Malignant neoplasm of prostate: Secondary | ICD-10-CM

## 2017-03-16 ENCOUNTER — Encounter (HOSPITAL_COMMUNITY)
Admission: RE | Admit: 2017-03-16 | Discharge: 2017-03-16 | Disposition: A | Discharge status: Home / Self Care | Payer: Medicare Other | Source: Ambulatory Visit | Attending: Urology | Admitting: Urology

## 2017-03-16 DIAGNOSIS — C61 Malignant neoplasm of prostate: Secondary | ICD-10-CM | POA: Diagnosis not present

## 2017-03-16 MED ORDER — TECHNETIUM TC 99M MEDRONATE IV KIT
21.1000 | PACK | Freq: Once | INTRAVENOUS | Status: AC | PRN
  Administered 2017-03-16: 21.1 via INTRAVENOUS

## 2017-05-03 DIAGNOSIS — C61 Malignant neoplasm of prostate: Secondary | ICD-10-CM | POA: Diagnosis not present

## 2017-06-13 ENCOUNTER — Other Ambulatory Visit: Payer: Self-pay

## 2017-06-13 ENCOUNTER — Ambulatory Visit (HOSPITAL_BASED_OUTPATIENT_CLINIC_OR_DEPARTMENT_OTHER): Payer: Medicare Other | Admitting: Anesthesiology

## 2017-06-13 ENCOUNTER — Encounter (HOSPITAL_BASED_OUTPATIENT_CLINIC_OR_DEPARTMENT_OTHER)
Admission: AD | Disposition: A | Discharge status: Home / Self Care | Payer: Self-pay | Source: Ambulatory Visit | Attending: Otolaryngology

## 2017-06-13 ENCOUNTER — Encounter (HOSPITAL_BASED_OUTPATIENT_CLINIC_OR_DEPARTMENT_OTHER): Payer: Self-pay | Admitting: Anesthesiology

## 2017-06-13 ENCOUNTER — Ambulatory Visit (HOSPITAL_BASED_OUTPATIENT_CLINIC_OR_DEPARTMENT_OTHER)
Admission: AD | Admit: 2017-06-13 | Discharge: 2017-06-14 | Disposition: A | Discharge status: Home / Self Care | Payer: Medicare Other | Source: Ambulatory Visit | Attending: Otolaryngology | Admitting: Otolaryngology

## 2017-06-13 DIAGNOSIS — C61 Malignant neoplasm of prostate: Secondary | ICD-10-CM | POA: Diagnosis not present

## 2017-06-13 DIAGNOSIS — Z7982 Long term (current) use of aspirin: Secondary | ICD-10-CM | POA: Insufficient documentation

## 2017-06-13 DIAGNOSIS — R040 Epistaxis: Secondary | ICD-10-CM | POA: Diagnosis not present

## 2017-06-13 DIAGNOSIS — D649 Anemia, unspecified: Secondary | ICD-10-CM | POA: Diagnosis not present

## 2017-06-13 HISTORY — PX: NASAL ENDOSCOPY WITH EPISTAXIS CONTROL: SHX5664

## 2017-06-13 LAB — POCT HEMOGLOBIN-HEMACUE: Hemoglobin: 10.8 g/dL — ABNORMAL LOW (ref 13.0–17.0)

## 2017-06-13 SURGERY — CONTROL OF EPISTAXIS, ENDOSCOPIC
Anesthesia: General | Site: Nose | Laterality: Left

## 2017-06-13 MED ORDER — SCOPOLAMINE 1 MG/3DAYS TD PT72: 1.0000 | MEDICATED_PATCH | Freq: Once | TRANSDERMAL | Status: DC | PRN

## 2017-06-13 MED ORDER — EPHEDRINE 5 MG/ML INJ
INTRAVENOUS | Status: AC
  Filled 2017-06-13: qty 10

## 2017-06-13 MED ORDER — MUPIROCIN 2 % EX OINT
TOPICAL_OINTMENT | CUTANEOUS | Status: AC
  Filled 2017-06-13: qty 22

## 2017-06-13 MED ORDER — ONDANSETRON HCL 4 MG/2ML IJ SOLN
INTRAMUSCULAR | Status: DC | PRN
  Administered 2017-06-13: 4 mg via INTRAVENOUS

## 2017-06-13 MED ORDER — DEXAMETHASONE SODIUM PHOSPHATE 10 MG/ML IJ SOLN
INTRAMUSCULAR | Status: AC
  Filled 2017-06-13: qty 1

## 2017-06-13 MED ORDER — OXYMETAZOLINE HCL 0.05 % NA SOLN
NASAL | Status: AC
  Filled 2017-06-13: qty 15

## 2017-06-13 MED ORDER — PHENYLEPHRINE 40 MCG/ML (10ML) SYRINGE FOR IV PUSH (FOR BLOOD PRESSURE SUPPORT)
PREFILLED_SYRINGE | INTRAVENOUS | Status: AC
  Filled 2017-06-13: qty 10

## 2017-06-13 MED ORDER — FENTANYL CITRATE (PF) 100 MCG/2ML IJ SOLN: 25.0000 ug | INTRAMUSCULAR | Status: DC | PRN

## 2017-06-13 MED ORDER — PROPOFOL 10 MG/ML IV BOLUS
INTRAVENOUS | Status: DC | PRN
  Administered 2017-06-13: 150 mg via INTRAVENOUS

## 2017-06-13 MED ORDER — FENTANYL CITRATE (PF) 100 MCG/2ML IJ SOLN
INTRAMUSCULAR | Status: AC
  Filled 2017-06-13: qty 2

## 2017-06-13 MED ORDER — OXYMETAZOLINE HCL 0.05 % NA SOLN
NASAL | Status: DC | PRN
  Administered 2017-06-13: 1 via TOPICAL

## 2017-06-13 MED ORDER — SUCCINYLCHOLINE CHLORIDE 20 MG/ML IJ SOLN
INTRAMUSCULAR | Status: DC | PRN
  Administered 2017-06-13: 80 mg via INTRAVENOUS

## 2017-06-13 MED ORDER — EPHEDRINE SULFATE-NACL 50-0.9 MG/10ML-% IV SOSY
PREFILLED_SYRINGE | INTRAVENOUS | Status: DC | PRN
  Administered 2017-06-13 (×3): 5 mg via INTRAVENOUS

## 2017-06-13 MED ORDER — ACETAMINOPHEN 650 MG RE SUPP
650.0000 mg | RECTAL | Status: DC | PRN
Start: 2017-06-13 — End: 2017-06-14

## 2017-06-13 MED ORDER — MIDAZOLAM HCL 2 MG/2ML IJ SOLN: 1.0000 mg | INTRAMUSCULAR | Status: DC | PRN

## 2017-06-13 MED ORDER — LIDOCAINE 2% (20 MG/ML) 5 ML SYRINGE
INTRAMUSCULAR | Status: DC | PRN
  Administered 2017-06-13: 100 mg via INTRAVENOUS

## 2017-06-13 MED ORDER — FENTANYL CITRATE (PF) 100 MCG/2ML IJ SOLN
50.0000 ug | INTRAMUSCULAR | Status: DC | PRN
  Administered 2017-06-13 (×2): 50 ug via INTRAVENOUS

## 2017-06-13 MED ORDER — DEXAMETHASONE SODIUM PHOSPHATE 4 MG/ML IJ SOLN
INTRAMUSCULAR | Status: DC | PRN
  Administered 2017-06-13: 5 mg via INTRAVENOUS

## 2017-06-13 MED ORDER — PROPOFOL 10 MG/ML IV BOLUS
INTRAVENOUS | Status: AC
  Filled 2017-06-13: qty 20

## 2017-06-13 MED ORDER — LACTATED RINGERS IV SOLN
INTRAVENOUS | Status: DC
  Administered 2017-06-13: 14:00:00 via INTRAVENOUS

## 2017-06-13 MED ORDER — PHENYLEPHRINE 40 MCG/ML (10ML) SYRINGE FOR IV PUSH (FOR BLOOD PRESSURE SUPPORT)
PREFILLED_SYRINGE | INTRAVENOUS | Status: DC | PRN
  Administered 2017-06-13: 80 ug via INTRAVENOUS
  Administered 2017-06-13 (×2): 40 ug via INTRAVENOUS

## 2017-06-13 MED ORDER — ACETAMINOPHEN 160 MG/5ML PO SOLN: 650.0000 mg | ORAL | Status: DC | PRN

## 2017-06-13 MED ORDER — BACITRACIN ZINC 500 UNIT/GM EX OINT: 1.0000 "application " | TOPICAL_OINTMENT | Freq: Three times a day (TID) | CUTANEOUS | Status: DC

## 2017-06-13 MED ORDER — SILVER NITRATE-POT NITRATE 75-25 % EX MISC
CUTANEOUS | Status: AC
  Filled 2017-06-13: qty 1

## 2017-06-13 MED ORDER — ONDANSETRON HCL 4 MG/2ML IJ SOLN: 4.0000 mg | Freq: Once | INTRAMUSCULAR | Status: DC | PRN

## 2017-06-13 MED ORDER — LIDOCAINE-EPINEPHRINE 1 %-1:100000 IJ SOLN
INTRAMUSCULAR | Status: AC
  Filled 2017-06-13: qty 1

## 2017-06-13 SURGICAL SUPPLY — 37 items
APPLICATOR COTTON TIP 6IN STRL (MISCELLANEOUS) ×3 IMPLANT
CANISTER SUCT 1200ML W/VALVE (MISCELLANEOUS) ×3 IMPLANT
COAGULATOR SUCT 8FR VV (MISCELLANEOUS) IMPLANT
CONT SPEC 4OZ CLIKSEAL STRL BL (MISCELLANEOUS) IMPLANT
CORD BIPOLAR FORCEPS 12FT (ELECTRODE) IMPLANT
DECANTER SPIKE VIAL GLASS SM (MISCELLANEOUS) IMPLANT
DEPRESSOR TONGUE BLADE STERILE (MISCELLANEOUS) IMPLANT
DRESSING NASAL POPE 10X1.5X2.5 (GAUZE/BANDAGES/DRESSINGS) ×1 IMPLANT
DRSG NASAL POPE 10X1.5X2.5 (GAUZE/BANDAGES/DRESSINGS) ×3
DRSG TELFA 3X8 NADH (GAUZE/BANDAGES/DRESSINGS) IMPLANT
ELECT REM PT RETURN 9FT ADLT (ELECTROSURGICAL) ×3
ELECT REM PT RETURN 9FT PED (ELECTROSURGICAL)
ELECTRODE REM PT RETRN 9FT PED (ELECTROSURGICAL) IMPLANT
ELECTRODE REM PT RTRN 9FT ADLT (ELECTROSURGICAL) ×1 IMPLANT
GAUZE SPONGE 4X4 12PLY STRL LF (GAUZE/BANDAGES/DRESSINGS) ×3 IMPLANT
GLOVE ECLIPSE 6.5 STRL STRAW (GLOVE) ×3 IMPLANT
GLOVE SS BIOGEL STRL SZ 7.5 (GLOVE) ×1 IMPLANT
GLOVE SUPERSENSE BIOGEL SZ 7.5 (GLOVE) ×2
GOWN STRL REUS W/ TWL LRG LVL3 (GOWN DISPOSABLE) ×1 IMPLANT
GOWN STRL REUS W/ TWL XL LVL3 (GOWN DISPOSABLE) ×1 IMPLANT
GOWN STRL REUS W/TWL LRG LVL3 (GOWN DISPOSABLE) ×2
GOWN STRL REUS W/TWL XL LVL3 (GOWN DISPOSABLE) ×2
HEMOSTAT SURGICEL .5X2 ABSORB (HEMOSTASIS) ×3 IMPLANT
HEMOSTAT SURGICEL 2X14 (HEMOSTASIS) IMPLANT
MARKER SKIN DUAL TIP RULER LAB (MISCELLANEOUS) IMPLANT
NEEDLE PRECISIONGLIDE 27X1.5 (NEEDLE) IMPLANT
PACK BASIN DAY SURGERY FS (CUSTOM PROCEDURE TRAY) ×3 IMPLANT
PATTIES SURGICAL .5 X3 (DISPOSABLE) ×3 IMPLANT
SHEET MEDIUM DRAPE 40X70 STRL (DRAPES) ×3 IMPLANT
SOLUTION BUTLER CLEAR DIP (MISCELLANEOUS) IMPLANT
SPONGE GAUZE 2X2 8PLY STER LF (GAUZE/BANDAGES/DRESSINGS)
SPONGE GAUZE 2X2 8PLY STRL LF (GAUZE/BANDAGES/DRESSINGS) IMPLANT
SYR CONTROL 10ML LL (SYRINGE) IMPLANT
TOWEL OR 17X24 6PK STRL BLUE (TOWEL DISPOSABLE) ×3 IMPLANT
TUBE CONNECTING 20'X1/4 (TUBING) ×1
TUBE CONNECTING 20X1/4 (TUBING) ×2 IMPLANT
YANKAUER SUCT BULB TIP NO VENT (SUCTIONS) ×3 IMPLANT

## 2017-06-13 NOTE — Anesthesia Preprocedure Evaluation (Addendum)
Anesthesia Evaluation  Patient identified by MRN, date of birth, ID band Patient awake    Reviewed: Allergy & Precautions, NPO status , Patient's Chart, lab work & pertinent test results  Airway Mallampati: II  TM Distance: >3 FB Neck ROM: Full    Dental no notable dental hx.    Pulmonary neg pulmonary ROS,    Pulmonary exam normal breath sounds clear to auscultation       Cardiovascular negative cardio ROS Normal cardiovascular exam Rhythm:Regular Rate:Normal     Neuro/Psych negative neurological ROS  negative psych ROS   GI/Hepatic negative GI ROS, Neg liver ROS,   Endo/Other  negative endocrine ROS  Renal/GU negative Renal ROS   Recurrent prostate adenocarcinoma     Musculoskeletal negative musculoskeletal ROS (+)   Abdominal   Peds  Hematology negative hematology ROS (+)   Anesthesia Other Findings EPISTAXIS  Reproductive/Obstetrics                            Anesthesia Physical Anesthesia Plan  ASA: II  Anesthesia Plan: General   Post-op Pain Management:    Induction: Intravenous  PONV Risk Score and Plan: 2 and Ondansetron, Dexamethasone and Treatment may vary due to age or medical condition  Airway Management Planned: Oral ETT  Additional Equipment:   Intra-op Plan:   Post-operative Plan: Extubation in OR  Informed Consent: I have reviewed the patients History and Physical, chart, labs and discussed the procedure including the risks, benefits and alternatives for the proposed anesthesia with the patient or authorized representative who has indicated his/her understanding and acceptance.   Dental advisory given  Plan Discussed with: CRNA  Anesthesia Plan Comments:         Anesthesia Quick Evaluation

## 2017-06-13 NOTE — Anesthesia Procedure Notes (Signed)
Procedure Name: Intubation Date/Time: 06/13/2017 2:25 PM Performed by: Gwyndolyn Saxon, CRNA Pre-anesthesia Checklist: Patient identified, Emergency Drugs available, Suction available, Patient being monitored and Timeout performed Patient Re-evaluated:Patient Re-evaluated prior to induction Oxygen Delivery Method: Circle system utilized Preoxygenation: Pre-oxygenation with 100% oxygen Induction Type: IV induction, Rapid sequence and Cricoid Pressure applied Laryngoscope Size: Mac and 4 Grade View: Grade II Tube type: Oral Tube size: 8.0 mm Number of attempts: 1 Placement Confirmation: ETT inserted through vocal cords under direct vision,  positive ETCO2,  CO2 detector and breath sounds checked- equal and bilateral Secured at: 23 cm Tube secured with: Tape Dental Injury: Teeth and Oropharynx as per pre-operative assessment

## 2017-06-13 NOTE — Discharge Instructions (Addendum)
Take tylenol prn pain of discomfort. Do not take aspirin for the next week Use saline nasal spray if your nose feels dry or nasal gel sprat Do not blow your nose hard for the next week. OK to blow gently. Call Dr Lucia Gaskins if you have any problems or questions.   [phone] Take your regular meds.   Post Anesthesia Home Care Instructions  Activity: Get plenty of rest for the remainder of the day. A responsible individual must stay with you for 24 hours following the procedure.  For the next 24 hours, DO NOT: -Drive a car -Paediatric nurse -Drink alcoholic beverages -Take any medication unless instructed by your physician -Make any legal decisions or sign important papers.  Meals: Start with liquid foods such as gelatin or soup. Progress to regular foods as tolerated. Avoid greasy, spicy, heavy foods. If nausea and/or vomiting occur, drink only clear liquids until the nausea and/or vomiting subsides. Call your physician if vomiting continues.  Special Instructions/Symptoms: Your throat may feel dry or sore from the anesthesia or the breathing tube placed in your throat during surgery. If this causes discomfort, gargle with warm salt water. The discomfort should disappear within 24 hours.  If you had a scopolamine patch placed behind your ear for the management of post- operative nausea and/or vomiting:  1. The medication in the patch is effective for 72 hours, after which it should be removed.  Wrap patch in a tissue and discard in the trash. Wash hands thoroughly with soap and water. 2. You may remove the patch earlier than 72 hours if you experience unpleasant side effects which may include dry mouth, dizziness or visual disturbances. 3. Avoid touching the patch. Wash your hands with soap and water after contact with the patch.

## 2017-06-13 NOTE — Brief Op Note (Signed)
06/13/2017  2:52 PM  PATIENT:  Lucas Navarro  82 y.o. male  PRE-OPERATIVE DIAGNOSIS:  EPISTAXIS LEFT  POST-OPERATIVE DIAGNOSIS:  EPISTAXIS  PROCEDURE:  Procedure(s): NASAL EXAM WITH ENDOSCOPIC CAUTERIZATION LEFT SIDED  EPISTAXIS (Left)  SURGEON:  Surgeon(s) and Role:    Rozetta Nunnery, MD - Primary  PHYSICIAN ASSISTANT:   ASSISTANTS: none   ANESTHESIA:   general  EBL:  5 mL   BLOOD ADMINISTERED:none  DRAINS: none   LOCAL MEDICATIONS USED:  NONE  SPECIMEN:  No Specimen  DISPOSITION OF SPECIMEN:  N/A  COUNTS:  YES  TOURNIQUET:  * No tourniquets in log *  DICTATION: .Other Dictation: Dictation Number [id number]  PLAN OF CARE: Admit for overnight observation  PATIENT DISPOSITION:  PACU - hemodynamically stable.   Delay start of Pharmacological VTE agent (>24hrs) due to surgical blood loss or risk of bleeding: yes

## 2017-06-13 NOTE — Transfer of Care (Signed)
Immediate Anesthesia Transfer of Care Note  Patient: Lucas Navarro  Procedure(s) Performed: NASAL EXAM WITH ENDOSCOPIC CAUTERIZATION LEFT SIDED  EPISTAXIS (Left Nose)  Patient Location: PACU  Anesthesia Type:General  Level of Consciousness: awake  Airway & Oxygen Therapy: Patient Spontanous Breathing and Patient connected to face mask oxygen  Post-op Assessment: Report given to RN and Post -op Vital signs reviewed and stable  Post vital signs: Reviewed and stable  Last Vitals:  Vitals:   06/13/17 1317  BP: (!) 152/68  Pulse: 85  Resp: 20  Temp: 36.4 C  SpO2: 100%    Last Pain:  Vitals:   06/13/17 1317  TempSrc: Oral         Complications: No apparent anesthesia complications

## 2017-06-13 NOTE — Anesthesia Postprocedure Evaluation (Signed)
Anesthesia Post Note  Patient: Jermie Hippe Postell  Procedure(s) Performed: NASAL EXAM WITH ENDOSCOPIC CAUTERIZATION LEFT SIDED  EPISTAXIS (Left Nose)     Patient location during evaluation: PACU Anesthesia Type: General Level of consciousness: awake and alert Pain management: pain level controlled Vital Signs Assessment: post-procedure vital signs reviewed and stable Respiratory status: spontaneous breathing, nonlabored ventilation, respiratory function stable and patient connected to nasal cannula oxygen Cardiovascular status: blood pressure returned to baseline and stable Postop Assessment: no apparent nausea or vomiting Anesthetic complications: no    Last Vitals:  Vitals:   06/13/17 1536 06/13/17 1946  BP: 119/64 (!) 101/53  Pulse: 77 78  Resp: 16 16  Temp: 36.6 C 36.6 C  SpO2: 100% 100%    Last Pain:  Vitals:   06/13/17 1946  TempSrc:   PainSc: 0-No pain                 Dacotah Cabello P Tenessa Marsee

## 2017-06-13 NOTE — H&P (Signed)
PREOPERATIVE H&P  Chief Complaint: Left-sided epistaxis  HPI: Lucas Navarro is a 82 y.o. male who presents for evaluation of recent left-sided epistaxis. The last 2 nights he's had nosebleeds in the middle of the night that ran down the back of his throat. He was subsequently referred to me for further evaluation of the nosebleeds as his hemoglobin and hematocrit had decreased. He denies previous history of nosebleeds. On exam in the office the anterior nasal cavity was clear but he developed a posterior left nasal bleed while in the office requiring nasal packing with a merocel pack that significant slowed the bleeding. He is taken to the operating room at this time for endoscopic cauterization of left posterior epistaxis.  Past Medical History:  Diagnosis Date  . Cancer ([place]) 2010   hx malignant melanoma of the skin left temple excised DR.Danella Sensing  . ED (erectile dysfunction)   . History of radiation therapy 11/15/11-01/12/12   prostate 7800cGy seminal vesicles 5600cGy/40 sessions  . Macular degeneration   . Prolapsing mitral valve    hx prolapsing mitral valve leaflet syndrome  . Prostate CA ([place]) 12/2007   dx Adenocarcinoma  . Recurrent prostate adenocarcinoma ([place]) 09/07/11   bx= recurrentAdenocarcinoma,PSA=7.30,gleason=4+3=7,&3+4=7,volume=33.40cc  . Skin cancer    Past Surgical History:  Procedure Laterality Date  . CATARACT EXTRACTION     bilateral   . colonscopy    . TONSILLECTOMY     Social History   Socioeconomic History  . Marital status: Single    Spouse name: None  . Number of children: 0  . Years of education: None  . Highest education level: None  Social Needs  . Financial resource strain: None  . Food insecurity - worry: None  . Food insecurity - inability: None  . Transportation needs - medical: None  . Transportation needs - non-medical: None  Occupational History    Comment: worked as Marine scientist for [employer]  . Smoking status:  Never Smoker  . Smokeless tobacco: Never Used  Substance and Sexual Activity  . Alcohol use: No    Comment: only on occasion socially  . Drug use: No  . Sexual activity: No  Other Topics Concern  . None  Social History Narrative  . None   Family History  Problem Relation Age of Onset  . Cancer Mother        fibroid  . Cancer Father        prostate   No Known Allergies Prior to Admission medications   Medication Sig Start Date End Date Taking? Authorizing Provider  aspirin 81 MG tablet Take 81 mg by mouth daily.   Yes [provider]  Multiple Vitamins-Minerals (EYE VITAMINS) CAPS Take by mouth.   Yes [provider]  polyethylene glycol (MIRALAX / GLYCOLAX) packet Take 17 g by mouth as needed.   Yes [provider]     Positive ROS: Negative. He is scheduled for prostate biopsy tomorrow morning.  All other systems have been reviewed and were otherwise negative with the exception of those mentioned in the HPI and as above.  Physical Exam: Vitals:   06/13/17 1317  BP: (!) 152/68  Pulse: 85  Resp: 20  Temp: 97.6 F (36.4 C)  SpO2: 100%    General: Alert, no acute distress Oral: Normal oral mucosa and tonsils Nasal: Clear nasal passages. Clear nasal passages anteriorly. Epistaxis from the left posterior region requiring nasal packing Neck: No palpable adenopathy or thyroid nodules Ear: Ear canal  is clear with normal appearing TMs Cardiovascular: Regular rate and rhythm, no murmur.  Respiratory: Clear to auscultation Neurologic: Alert and oriented x 3   Assessment/Plan: EPISTAXIS Plan for Procedure(s): NASAL EXAM WITH ENDOSCOPIC CAUTERIZATION LEFT SIDED  EPISTAXIS   Melony Overly, MD 06/13/2017 1:22 PM

## 2017-06-14 ENCOUNTER — Encounter (HOSPITAL_BASED_OUTPATIENT_CLINIC_OR_DEPARTMENT_OTHER): Payer: Self-pay | Admitting: Otolaryngology

## 2017-06-14 NOTE — Op Note (Signed)
NAME:  PERKINS, MOLINA NO.:  [id number]  MEDICAL RECORD NO.:  [id number]  LOCATION:                                 FACILITY:  PHYSICIAN:  Leonides Sake. Lucia Gaskins, M.D. DATE OF BIRTH:  DATE OF PROCEDURE:  06/13/2017 DATE OF DISCHARGE:                              OPERATIVE REPORT   PREOPERATIVE DIAGNOSIS:  Recurrent left-sided epistaxis.  POSTOPERATIVE DIAGNOSIS:  Left posterior epistaxis.  OPERATION PERFORMED:  Endoscopic cauterization of left-sided epistaxis.  SURGEON:  Leonides Sake. Lucia Gaskins, MD.  ANESTHESIA:  General endotracheal.  COMPLICATIONS:  None.  BLOOD LOSS:  Minimal.  BRIEF CLINICAL NOTE:  Lucas Navarro is a 82 year old gentleman who has had recurrent left-sided epistaxis, mostly at night.  Apparently at his medical physician's office, his hemoglobin had dropped over the last year.  His most recent hemoglobin was approximately 11.5 and hematocrit of 36.  During exam in the office, the patient had a fairly pronounced posterior left-sided nosebleed that had to be controlled with Merocel nasal pack.  He was subsequently taken to the operating room later the same day to have endoscopic cauterization of left-sided epistaxis.  DESCRIPTION OF PROCEDURE:  The patient was brought to the operating room with a Merocel pack on the left side.  He underwent general endotracheal anesthesia.  The nasal pack was removed.  There was no profuse bleeding on initial removal of the nasal pack.  There was some blood clot in the middle meatus area; and removing the blood clot, he had some bleeding from what appeared to be the inferior medial aspect of the left middle turbinate.  This was cauterized with suction cautery at 25 watts.  Of note, the patient also had a large posterior left septal spur that had bled some, probably from the placement of the pack.  This was also cauterized.  A separate anterior mid septal area was also cauterized with the suction cautery.   Some areas along the left inferior turbinate were also cauterized with suction cautery.  On endoscopic exam of the nose, the posterior nasal cavity posterior to the septal spur was clear as was the superior aspect of the nasal cavity.  After cauterizing the middle turbinate on the left side, the middle meatus was evaluated and this likewise was really clear of any blood clot.  After cauterization of the areas of bleeding, the procedure was completed.  A small pack of Surgicel soaked in mupirocin ointment was placed in the left middle meatus, adjacent to the area of bleeding on the left middle turbinate. The right nasal cavity was clear to evaluation.  This completed the procedure.  Orogastric tube was placed to suction the stomach with no substantial blood.  The patient was subsequently awoken from anesthesia and transferred to the recovery room postoperatively doing well.  DISPOSITION:  The patient will stay overnight because of present location at a friend's home.  It would be easier for him to stay here overnight.  We will plan on discharge in the morning as he cannot drive himself home.  He will follow up in my office p.r.n. and further nasal bleeds.          ______________________________ Leonides Sake Lucia Gaskins,  M.D.     CEN/MEDQ  D:  06/13/2017  T:  06/14/2017  Job:  [id number]

## 2017-06-19 DIAGNOSIS — L308 Other specified dermatitis: Secondary | ICD-10-CM | POA: Diagnosis not present

## 2017-06-19 DIAGNOSIS — L821 Other seborrheic keratosis: Secondary | ICD-10-CM | POA: Diagnosis not present

## 2017-06-19 DIAGNOSIS — L820 Inflamed seborrheic keratosis: Secondary | ICD-10-CM | POA: Diagnosis not present

## 2017-06-19 DIAGNOSIS — L812 Freckles: Secondary | ICD-10-CM | POA: Diagnosis not present

## 2017-06-25 DIAGNOSIS — H353132 Nonexudative age-related macular degeneration, bilateral, intermediate dry stage: Secondary | ICD-10-CM | POA: Diagnosis not present

## 2017-06-25 DIAGNOSIS — H52203 Unspecified astigmatism, bilateral: Secondary | ICD-10-CM | POA: Diagnosis not present

## 2017-06-25 DIAGNOSIS — Z961 Presence of intraocular lens: Secondary | ICD-10-CM | POA: Diagnosis not present

## 2017-07-25 DIAGNOSIS — H43812 Vitreous degeneration, left eye: Secondary | ICD-10-CM | POA: Diagnosis not present

## 2017-07-25 DIAGNOSIS — H353131 Nonexudative age-related macular degeneration, bilateral, early dry stage: Secondary | ICD-10-CM | POA: Diagnosis not present

## 2017-07-26 DIAGNOSIS — C61 Malignant neoplasm of prostate: Secondary | ICD-10-CM | POA: Diagnosis not present

## 2017-08-09 ENCOUNTER — Other Ambulatory Visit: Payer: Self-pay | Admitting: Urology

## 2017-08-09 DIAGNOSIS — C61 Malignant neoplasm of prostate: Secondary | ICD-10-CM

## 2017-08-24 ENCOUNTER — Encounter (HOSPITAL_COMMUNITY)
Admission: RE | Admit: 2017-08-24 | Discharge: 2017-08-24 | Disposition: A | Discharge status: Home / Self Care | Payer: Medicare Other | Source: Ambulatory Visit | Attending: Urology | Admitting: Urology

## 2017-08-24 DIAGNOSIS — C61 Malignant neoplasm of prostate: Secondary | ICD-10-CM | POA: Insufficient documentation

## 2017-08-24 MED ORDER — TECHNETIUM TC 99M MEDRONATE IV KIT
21.0000 | PACK | Freq: Once | INTRAVENOUS | Status: AC | PRN
  Administered 2017-08-24: 21 via INTRAVENOUS

## 2017-08-28 DIAGNOSIS — E441 Mild protein-calorie malnutrition: Secondary | ICD-10-CM | POA: Diagnosis not present

## 2017-08-28 DIAGNOSIS — E559 Vitamin D deficiency, unspecified: Secondary | ICD-10-CM | POA: Diagnosis not present

## 2017-08-28 DIAGNOSIS — N183 Chronic kidney disease, stage 3 (moderate): Secondary | ICD-10-CM | POA: Diagnosis not present

## 2017-08-28 DIAGNOSIS — Z0000 Encounter for general adult medical examination without abnormal findings: Secondary | ICD-10-CM | POA: Diagnosis not present

## 2017-09-04 DIAGNOSIS — Z0000 Encounter for general adult medical examination without abnormal findings: Secondary | ICD-10-CM | POA: Diagnosis not present

## 2017-09-04 DIAGNOSIS — E559 Vitamin D deficiency, unspecified: Secondary | ICD-10-CM | POA: Diagnosis not present

## 2017-09-28 DIAGNOSIS — C61 Malignant neoplasm of prostate: Secondary | ICD-10-CM | POA: Diagnosis not present

## 2017-10-05 DIAGNOSIS — C61 Malignant neoplasm of prostate: Secondary | ICD-10-CM | POA: Diagnosis not present

## 2017-10-09 DIAGNOSIS — H35411 Lattice degeneration of retina, right eye: Secondary | ICD-10-CM | POA: Diagnosis not present

## 2017-10-09 DIAGNOSIS — H43811 Vitreous degeneration, right eye: Secondary | ICD-10-CM | POA: Diagnosis not present

## 2017-11-08 DIAGNOSIS — H353131 Nonexudative age-related macular degeneration, bilateral, early dry stage: Secondary | ICD-10-CM | POA: Diagnosis not present

## 2017-11-08 DIAGNOSIS — H43813 Vitreous degeneration, bilateral: Secondary | ICD-10-CM | POA: Diagnosis not present

## 2017-12-31 DIAGNOSIS — H903 Sensorineural hearing loss, bilateral: Secondary | ICD-10-CM | POA: Diagnosis not present

## 2018-02-06 DIAGNOSIS — H903 Sensorineural hearing loss, bilateral: Secondary | ICD-10-CM | POA: Diagnosis not present

## 2018-03-04 DIAGNOSIS — E559 Vitamin D deficiency, unspecified: Secondary | ICD-10-CM | POA: Diagnosis not present

## 2018-03-04 DIAGNOSIS — Z0000 Encounter for general adult medical examination without abnormal findings: Secondary | ICD-10-CM | POA: Diagnosis not present

## 2018-03-11 DIAGNOSIS — N183 Chronic kidney disease, stage 3 (moderate): Secondary | ICD-10-CM | POA: Diagnosis not present

## 2018-03-11 DIAGNOSIS — E559 Vitamin D deficiency, unspecified: Secondary | ICD-10-CM | POA: Diagnosis not present

## 2018-03-28 DIAGNOSIS — H353132 Nonexudative age-related macular degeneration, bilateral, intermediate dry stage: Secondary | ICD-10-CM | POA: Diagnosis not present

## 2018-03-28 DIAGNOSIS — H43813 Vitreous degeneration, bilateral: Secondary | ICD-10-CM | POA: Diagnosis not present

## 2018-05-03 ENCOUNTER — Other Ambulatory Visit: Payer: Self-pay | Admitting: Urology

## 2018-05-03 ENCOUNTER — Other Ambulatory Visit (HOSPITAL_COMMUNITY): Payer: Self-pay | Admitting: Urology

## 2018-05-03 DIAGNOSIS — C61 Malignant neoplasm of prostate: Secondary | ICD-10-CM

## 2018-05-15 ENCOUNTER — Encounter (HOSPITAL_COMMUNITY)
Admission: RE | Admit: 2018-05-15 | Discharge: 2018-05-15 | Disposition: A | Discharge status: Home / Self Care | Payer: Medicare Other | Source: Ambulatory Visit | Attending: Urology | Admitting: Urology

## 2018-05-15 DIAGNOSIS — C61 Malignant neoplasm of prostate: Secondary | ICD-10-CM | POA: Insufficient documentation

## 2018-05-15 MED ORDER — TECHNETIUM TC 99M MEDRONATE IV KIT
20.0000 | PACK | Freq: Once | INTRAVENOUS | Status: AC | PRN
  Administered 2018-05-15: 20 via INTRAVENOUS

## 2018-06-28 DIAGNOSIS — L853 Xerosis cutis: Secondary | ICD-10-CM | POA: Diagnosis not present

## 2018-06-28 DIAGNOSIS — D1801 Hemangioma of skin and subcutaneous tissue: Secondary | ICD-10-CM | POA: Diagnosis not present

## 2018-06-28 DIAGNOSIS — Z85820 Personal history of malignant melanoma of skin: Secondary | ICD-10-CM | POA: Diagnosis not present

## 2018-06-28 DIAGNOSIS — L821 Other seborrheic keratosis: Secondary | ICD-10-CM | POA: Diagnosis not present

## 2018-07-01 DIAGNOSIS — H52203 Unspecified astigmatism, bilateral: Secondary | ICD-10-CM | POA: Diagnosis not present

## 2018-07-01 DIAGNOSIS — Z961 Presence of intraocular lens: Secondary | ICD-10-CM | POA: Diagnosis not present

## 2018-07-01 DIAGNOSIS — H353132 Nonexudative age-related macular degeneration, bilateral, intermediate dry stage: Secondary | ICD-10-CM | POA: Diagnosis not present

## 2018-08-29 DIAGNOSIS — E559 Vitamin D deficiency, unspecified: Secondary | ICD-10-CM | POA: Diagnosis not present

## 2018-08-29 DIAGNOSIS — N183 Chronic kidney disease, stage 3 (moderate): Secondary | ICD-10-CM | POA: Diagnosis not present

## 2018-08-29 DIAGNOSIS — E441 Mild protein-calorie malnutrition: Secondary | ICD-10-CM | POA: Diagnosis not present

## 2018-08-29 DIAGNOSIS — D649 Anemia, unspecified: Secondary | ICD-10-CM | POA: Diagnosis not present

## 2018-08-29 DIAGNOSIS — Z0000 Encounter for general adult medical examination without abnormal findings: Secondary | ICD-10-CM | POA: Diagnosis not present

## 2018-09-05 DIAGNOSIS — E559 Vitamin D deficiency, unspecified: Secondary | ICD-10-CM | POA: Diagnosis not present

## 2018-09-05 DIAGNOSIS — N183 Chronic kidney disease, stage 3 (moderate): Secondary | ICD-10-CM | POA: Diagnosis not present

## 2018-09-05 DIAGNOSIS — E441 Mild protein-calorie malnutrition: Secondary | ICD-10-CM | POA: Diagnosis not present

## 2018-09-05 DIAGNOSIS — Z0000 Encounter for general adult medical examination without abnormal findings: Secondary | ICD-10-CM | POA: Diagnosis not present

## 2018-10-10 DIAGNOSIS — H43813 Vitreous degeneration, bilateral: Secondary | ICD-10-CM | POA: Diagnosis not present

## 2018-10-10 DIAGNOSIS — H35363 Drusen (degenerative) of macula, bilateral: Secondary | ICD-10-CM | POA: Diagnosis not present

## 2018-10-10 DIAGNOSIS — Z961 Presence of intraocular lens: Secondary | ICD-10-CM | POA: Diagnosis not present

## 2018-10-10 DIAGNOSIS — H353131 Nonexudative age-related macular degeneration, bilateral, early dry stage: Secondary | ICD-10-CM | POA: Diagnosis not present

## 2019-03-03 DIAGNOSIS — I10 Essential (primary) hypertension: Secondary | ICD-10-CM | POA: Diagnosis not present

## 2019-03-03 DIAGNOSIS — E785 Hyperlipidemia, unspecified: Secondary | ICD-10-CM | POA: Diagnosis not present

## 2019-03-03 DIAGNOSIS — E441 Mild protein-calorie malnutrition: Secondary | ICD-10-CM | POA: Diagnosis not present

## 2019-03-03 DIAGNOSIS — D519 Vitamin B12 deficiency anemia, unspecified: Secondary | ICD-10-CM | POA: Diagnosis not present

## 2019-03-03 DIAGNOSIS — D649 Anemia, unspecified: Secondary | ICD-10-CM | POA: Diagnosis not present

## 2019-03-10 DIAGNOSIS — D519 Vitamin B12 deficiency anemia, unspecified: Secondary | ICD-10-CM | POA: Diagnosis not present

## 2019-05-18 IMAGING — NM NM BONE WHOLE BODY
2 series · 2 of 2 positions shown · non-contrast
Comparison: 08/24/2017, CT 05/15/2018

CLINICAL DATA: Prostate cancer

EXAM:
NUCLEAR MEDICINE WHOLE BODY BONE SCAN
TECHNIQUE: Whole body anterior and posterior images were obtained approximately
3 hours after intravenous injection of radiopharmaceutical.
RADIOPHARMACEUTICALS:  20.0 mCi 0echnetium-MMm MDP IV

[Series 1: whole body · 2.66mm/px · 1 of 1 slices shown (1 of 2)]
[im 1/1]
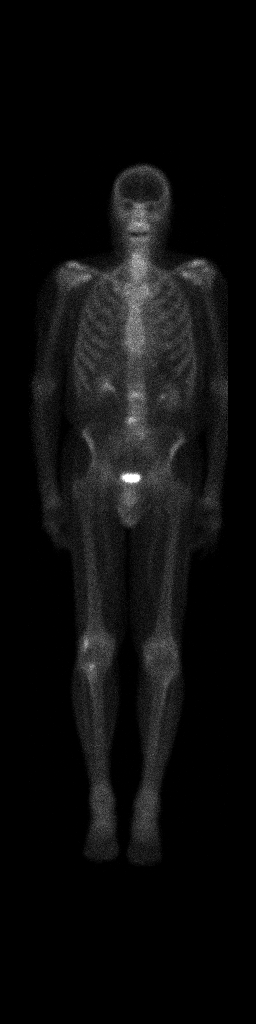

[Series 1: whole body · 2.66mm/px · 1 of 1 slices shown (2 of 2)]
[im 1/1]
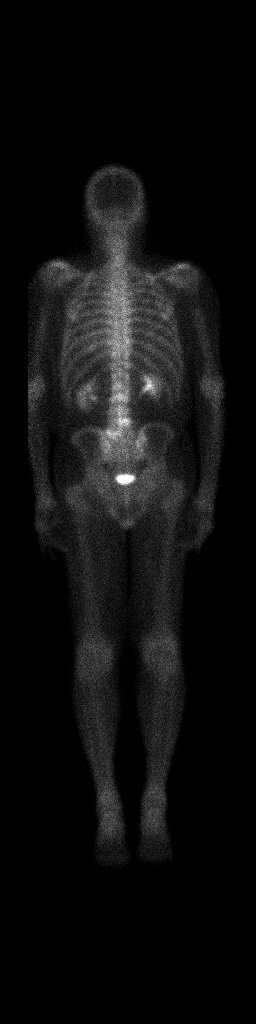

[2 of 2 positions shown; findings below may reference images not displayed]

FINDINGS: Increased activity noted in the lower lumbar spine, likely
degenerative. Degenerative uptake in the right knee. No suspicious
bony uptake to suggest osseous metastatic disease. Soft tissue
activity unremarkable.
IMPRESSION: Stable uptake in the mid to lower lumbar spine, likely degenerative.

No new suspicious area to suggest osseous metastatic disease.

## 2019-06-09 ENCOUNTER — Other Ambulatory Visit (HOSPITAL_COMMUNITY): Payer: Self-pay | Admitting: Urology

## 2019-06-09 ENCOUNTER — Other Ambulatory Visit: Payer: Self-pay | Admitting: Urology

## 2019-06-09 DIAGNOSIS — C61 Malignant neoplasm of prostate: Secondary | ICD-10-CM

## 2019-06-27 ENCOUNTER — Encounter (HOSPITAL_COMMUNITY)
Admission: RE | Admit: 2019-06-27 | Discharge: 2019-06-27 | Disposition: A | Discharge status: Home / Self Care | Payer: Medicare Other | Source: Ambulatory Visit | Attending: Urology | Admitting: Urology

## 2019-06-27 ENCOUNTER — Other Ambulatory Visit: Payer: Self-pay

## 2019-06-27 ENCOUNTER — Ambulatory Visit (HOSPITAL_COMMUNITY)
Admission: RE | Admit: 2019-06-27 | Discharge: 2019-06-27 | Disposition: A | Discharge status: Home / Self Care | Payer: Medicare Other | Source: Ambulatory Visit | Attending: Urology | Admitting: Urology

## 2019-06-27 DIAGNOSIS — C61 Malignant neoplasm of prostate: Secondary | ICD-10-CM | POA: Insufficient documentation

## 2019-06-27 MED ORDER — TECHNETIUM TC 99M MEDRONATE IV KIT
20.4000 | PACK | Freq: Once | INTRAVENOUS | Status: AC
  Administered 2019-06-27: 20.4 via INTRAVENOUS

## 2020-07-16 ENCOUNTER — Other Ambulatory Visit (HOSPITAL_COMMUNITY): Payer: Self-pay | Admitting: Urology

## 2020-07-16 DIAGNOSIS — C61 Malignant neoplasm of prostate: Secondary | ICD-10-CM

## 2020-08-02 ENCOUNTER — Encounter (HOSPITAL_COMMUNITY): Payer: Medicare Other

## 2020-08-04 ENCOUNTER — Encounter (HOSPITAL_COMMUNITY)
Admission: RE | Admit: 2020-08-04 | Discharge: 2020-08-04 | Disposition: A | Discharge status: Home / Self Care | Payer: Medicare Other | Source: Ambulatory Visit | Attending: Urology | Admitting: Urology

## 2020-08-04 ENCOUNTER — Other Ambulatory Visit: Payer: Self-pay

## 2020-08-04 DIAGNOSIS — C61 Malignant neoplasm of prostate: Secondary | ICD-10-CM | POA: Insufficient documentation

## 2020-08-04 MED ORDER — TECHNETIUM TC 99M MEDRONATE IV KIT
20.0000 | PACK | Freq: Once | INTRAVENOUS | Status: AC | PRN
  Administered 2020-08-04: 20 via INTRAVENOUS

## 2021-02-12 ENCOUNTER — Emergency Department (HOSPITAL_BASED_OUTPATIENT_CLINIC_OR_DEPARTMENT_OTHER)
Admission: EM | Admit: 2021-02-12 | Discharge: 2021-02-12 | Disposition: A | Discharge status: Home / Self Care | Payer: Medicare Other | Attending: Emergency Medicine | Admitting: Emergency Medicine

## 2021-02-12 ENCOUNTER — Encounter (HOSPITAL_BASED_OUTPATIENT_CLINIC_OR_DEPARTMENT_OTHER): Payer: Self-pay

## 2021-02-12 DIAGNOSIS — Z8546 Personal history of malignant neoplasm of prostate: Secondary | ICD-10-CM | POA: Diagnosis not present

## 2021-02-12 DIAGNOSIS — S81812A Laceration without foreign body, left lower leg, initial encounter: Secondary | ICD-10-CM | POA: Diagnosis not present

## 2021-02-12 DIAGNOSIS — Z85828 Personal history of other malignant neoplasm of skin: Secondary | ICD-10-CM | POA: Insufficient documentation

## 2021-02-12 DIAGNOSIS — Z7982 Long term (current) use of aspirin: Secondary | ICD-10-CM | POA: Insufficient documentation

## 2021-02-12 DIAGNOSIS — W268XXA Contact with other sharp object(s), not elsewhere classified, initial encounter: Secondary | ICD-10-CM | POA: Insufficient documentation

## 2021-02-12 NOTE — Discharge Instructions (Signed)
You had 11 stitches placed in your left leg.  The stitches need to be removed 7-10 days (Nov 4th-6th).  You can do this with your own home health nurse, or  at an urgent care, or return to the ER.  Keep them dry for the next 2 days.  The may continue to ooze a little bit through your dressings.  After that you can take down the dressings and the bleeding is stopped and leave it open to air.  You can shower after 2 days.

## 2021-02-12 NOTE — ED Provider Notes (Signed)
[facility] EMERGENCY DEPT Provider Note   CSN: [id number] Arrival date & time: 02/12/21  1932     History Chief Complaint  Patient presents with   Laceration    Lucas Navarro is a 85 y.o. male presenting today with a laceration to his left leg.  He reports that he cut his leg on a stage earlier this evening.  He is not on blood thinners.  His tetanus is up-to-date through his PCP.  No other injuries reported.  HPI     Past Medical History:  Diagnosis Date   Cancer ([place]) 2010   hx malignant melanoma of the skin left temple excised DR.Danella Sensing   ED (erectile dysfunction)    History of radiation therapy 11/15/11-01/12/12   prostate 7800cGy seminal vesicles 5600cGy/40 sessions   Macular degeneration    Prolapsing mitral valve    hx prolapsing mitral valve leaflet syndrome   Prostate CA ([place]) 12/2007   dx Adenocarcinoma   Recurrent prostate adenocarcinoma ([place]) 09/07/11   bx= recurrentAdenocarcinoma,PSA=7.30,gleason=4+3=7,&3+4=7,volume=33.40cc   Skin cancer     Patient Active Problem List   Diagnosis Date Noted   Acute posterior epistaxis 06/13/2017   Cancer ([place])    Malignant neoplasm of prostate ([place]) 09/07/2011   Prostate CA ([place]) 12/17/2007    Past Surgical History:  Procedure Laterality Date   CATARACT EXTRACTION     bilateral    colonscopy     NASAL ENDOSCOPY WITH EPISTAXIS CONTROL Left 06/13/2017   Procedure: NASAL EXAM WITH ENDOSCOPIC CAUTERIZATION LEFT SIDED  EPISTAXIS;  Surgeon: Rozetta Nunnery, MD;  Location: [facility];  Service: ENT;  Laterality: Left;   TONSILLECTOMY         Family History  Problem Relation Age of Onset   Cancer Mother        fibroid   Cancer Father        prostate    Social History   Tobacco Use   Smoking status: Never   Smokeless tobacco: Never  Substance Use Topics   Alcohol use: No    Comment: only on occasion socially   Drug use: No    Home Medications Prior to  Admission medications   Medication Sig Start Date End Date Taking? Authorizing Provider  aspirin 81 MG tablet Take 81 mg by mouth daily.    [provider]  Multiple Vitamins-Minerals (EYE VITAMINS) CAPS Take by mouth.    [provider]  polyethylene glycol (MIRALAX / GLYCOLAX) packet Take 17 g by mouth as needed.    [provider]    Allergies    Patient has no known allergies.  Review of Systems   Review of Systems  Constitutional:  Negative for chills and fever.  Respiratory:  Negative for cough and shortness of breath.   Cardiovascular:  Negative for chest pain and palpitations.  Gastrointestinal:  Negative for abdominal pain and vomiting.  Musculoskeletal:  Negative for arthralgias and back pain.  Skin:  Positive for rash and wound.  Neurological:  Negative for weakness and numbness.  All other systems reviewed and are negative.  Physical Exam Updated Vital Signs BP 132/63 (BP Location: Right Arm)   Pulse 71   Temp 98.4 F (36.9 C)   Resp 18   Ht 6\' 1"  (1.854 m)   Wt 72.6 kg   SpO2 100%   BMI 21.11 kg/m   Physical Exam Constitutional:      General: He is not in acute distress. HENT:  Head: Normocephalic and atraumatic.  Eyes:     Conjunctiva/sclera: Conjunctivae normal.     Pupils: Pupils are equal, round, and reactive to light.  Cardiovascular:     Rate and Rhythm: Normal rate and regular rhythm.  Pulmonary:     Effort: Pulmonary effort is normal. No respiratory distress.  Skin:    General: Skin is warm and dry.     Comments: Approx 7 cm dog-eared laceration to left anterior leg, no active bleeding  Neurological:     General: No focal deficit present.     Mental Status: He is alert. Mental status is at baseline.  Psychiatric:        Mood and Affect: Mood normal.        Behavior: Behavior normal.    ED Results / Procedures / Treatments   Labs (all labs ordered are listed, but only abnormal results are displayed) Labs  Reviewed - No data to display  EKG None  Radiology No results found.  Procedures .Marland KitchenLaceration Repair  Date/Time: 02/12/2021 10:09 PM Performed by: Wyvonnia Dusky, MD Authorized by: Wyvonnia Dusky, MD   Consent:    Consent obtained:  Verbal   Consent given by:  Patient   Risks, benefits, and alternatives were discussed: yes     Risks discussed:  Infection, pain, poor cosmetic result, poor wound healing and need for additional repair   Alternatives discussed:  No treatment Universal protocol:    Procedure explained and questions answered to patient or proxy's satisfaction: yes     Site/side marked: yes     Immediately prior to procedure, a time out was called: yes     Patient identity confirmed:  Arm band Anesthesia:    Anesthesia method:  Local infiltration   Local anesthetic:  Lidocaine 1% WITH epi Laceration details:    Location:  Leg   Leg location:  L lower leg   Length (cm):  7   Depth (mm):  3 Pre-procedure details:    Preparation:  Patient was prepped and draped in usual sterile fashion Exploration:    Hemostasis achieved with:  Direct pressure and epinephrine   Imaging outcome: foreign body not noted     Wound exploration: wound explored through full range of motion     Contaminated: no   Treatment:    Area cleansed with:  Saline   Amount of cleaning:  Standard   Irrigation solution:  Sterile saline Skin repair:    Repair method:  Sutures   Suture size:  4-0   Suture material:  Prolene   Suture technique:  Simple interrupted   Number of sutures:  11 Approximation:    Approximation:  Loose Repair type:    Repair type:  Intermediate Post-procedure details:    Dressing:  Non-adherent dressing   Procedure completion:  Tolerated well, no immediate complications Comments:     Gaping wound, difficult to obtain close approximation due to pressure on wound.     Medications Ordered in ED Medications - No data to display  ED Course  I have reviewed  the triage vital signs and the nursing notes.  Pertinent labs & imaging results that were available during my care of the patient were reviewed by me and considered in my medical decision making (see chart for details).  He has an injury left lower extremity.  Wound was irrigated, inspected, and closed with sutures.  I doubt underlying fracture.  Doubt infection.  No other injuries reported.  Hemostasis achieved.  He will  need suture removal in 7 to 10 days.  This provided in discharge instructions.  His tetanus is up-to-date through his PCP.   Final Clinical Impression(s) / ED Diagnoses Final diagnoses:  Laceration of left lower extremity, initial encounter    Rx / DC Orders ED Discharge Orders     None        Wyvonnia Dusky, MD 02/12/21 2211

## 2021-02-12 NOTE — ED Triage Notes (Addendum)
Pt was at a rehearsal and accidentally tripped on a wooden riser. Pt c/o left leg laceration. Stable bleeding. Wound currently wrapped in gauze. UTD on tetanus. Not on blood thinners.

## 2021-06-16 DIAGNOSIS — H903 Sensorineural hearing loss, bilateral: Secondary | ICD-10-CM | POA: Diagnosis not present

## 2021-07-14 DIAGNOSIS — B353 Tinea pedis: Secondary | ICD-10-CM | POA: Diagnosis not present

## 2021-07-14 DIAGNOSIS — L821 Other seborrheic keratosis: Secondary | ICD-10-CM | POA: Diagnosis not present

## 2021-07-14 DIAGNOSIS — D1801 Hemangioma of skin and subcutaneous tissue: Secondary | ICD-10-CM | POA: Diagnosis not present

## 2021-07-14 DIAGNOSIS — L812 Freckles: Secondary | ICD-10-CM | POA: Diagnosis not present

## 2021-07-19 DIAGNOSIS — H353122 Nonexudative age-related macular degeneration, left eye, intermediate dry stage: Secondary | ICD-10-CM | POA: Diagnosis not present

## 2021-07-19 DIAGNOSIS — H43813 Vitreous degeneration, bilateral: Secondary | ICD-10-CM | POA: Diagnosis not present

## 2021-07-19 DIAGNOSIS — H52203 Unspecified astigmatism, bilateral: Secondary | ICD-10-CM | POA: Diagnosis not present

## 2021-09-13 DIAGNOSIS — R7309 Other abnormal glucose: Secondary | ICD-10-CM | POA: Diagnosis not present

## 2021-09-13 DIAGNOSIS — I10 Essential (primary) hypertension: Secondary | ICD-10-CM | POA: Diagnosis not present

## 2021-09-13 DIAGNOSIS — E785 Hyperlipidemia, unspecified: Secondary | ICD-10-CM | POA: Diagnosis not present

## 2021-09-13 DIAGNOSIS — E559 Vitamin D deficiency, unspecified: Secondary | ICD-10-CM | POA: Diagnosis not present

## 2021-09-19 DIAGNOSIS — Z23 Encounter for immunization: Secondary | ICD-10-CM | POA: Diagnosis not present

## 2021-09-19 DIAGNOSIS — R7309 Other abnormal glucose: Secondary | ICD-10-CM | POA: Diagnosis not present

## 2021-09-19 DIAGNOSIS — Z0000 Encounter for general adult medical examination without abnormal findings: Secondary | ICD-10-CM | POA: Diagnosis not present

## 2021-09-19 DIAGNOSIS — E559 Vitamin D deficiency, unspecified: Secondary | ICD-10-CM | POA: Diagnosis not present

## 2021-09-19 DIAGNOSIS — I10 Essential (primary) hypertension: Secondary | ICD-10-CM | POA: Diagnosis not present

## 2021-09-19 DIAGNOSIS — N1831 Chronic kidney disease, stage 3a: Secondary | ICD-10-CM | POA: Diagnosis not present

## 2021-09-19 DIAGNOSIS — G629 Polyneuropathy, unspecified: Secondary | ICD-10-CM | POA: Diagnosis not present

## 2021-09-19 DIAGNOSIS — E785 Hyperlipidemia, unspecified: Secondary | ICD-10-CM | POA: Diagnosis not present

## 2021-12-05 ENCOUNTER — Other Ambulatory Visit (HOSPITAL_COMMUNITY): Payer: Self-pay | Admitting: Urology

## 2021-12-05 DIAGNOSIS — C61 Malignant neoplasm of prostate: Secondary | ICD-10-CM

## 2021-12-08 DIAGNOSIS — H353111 Nonexudative age-related macular degeneration, right eye, early dry stage: Secondary | ICD-10-CM | POA: Diagnosis not present

## 2021-12-08 DIAGNOSIS — H35453 Secondary pigmentary degeneration, bilateral: Secondary | ICD-10-CM | POA: Diagnosis not present

## 2021-12-08 DIAGNOSIS — Z961 Presence of intraocular lens: Secondary | ICD-10-CM | POA: Diagnosis not present

## 2021-12-08 DIAGNOSIS — H35722 Serous detachment of retinal pigment epithelium, left eye: Secondary | ICD-10-CM | POA: Diagnosis not present

## 2021-12-08 DIAGNOSIS — H35363 Drusen (degenerative) of macula, bilateral: Secondary | ICD-10-CM | POA: Diagnosis not present

## 2021-12-08 DIAGNOSIS — H5315 Visual distortions of shape and size: Secondary | ICD-10-CM | POA: Diagnosis not present

## 2021-12-08 DIAGNOSIS — H353122 Nonexudative age-related macular degeneration, left eye, intermediate dry stage: Secondary | ICD-10-CM | POA: Diagnosis not present

## 2021-12-08 DIAGNOSIS — H43813 Vitreous degeneration, bilateral: Secondary | ICD-10-CM | POA: Diagnosis not present

## 2021-12-09 ENCOUNTER — Ambulatory Visit (HOSPITAL_COMMUNITY)
Admission: RE | Admit: 2021-12-09 | Discharge: 2021-12-09 | Disposition: A | Discharge status: Home / Self Care | Payer: Medicare Other | Source: Ambulatory Visit | Attending: Urology | Admitting: Urology

## 2021-12-09 DIAGNOSIS — C61 Malignant neoplasm of prostate: Secondary | ICD-10-CM | POA: Insufficient documentation

## 2021-12-09 DIAGNOSIS — C7951 Secondary malignant neoplasm of bone: Secondary | ICD-10-CM | POA: Diagnosis not present

## 2021-12-09 MED ORDER — PIFLIFOLASTAT F 18 (PYLARIFY) INJECTION
9.0000 | Freq: Once | INTRAVENOUS | Status: AC
  Administered 2021-12-09: 8.93 via INTRAVENOUS

## 2022-01-19 DIAGNOSIS — C7951 Secondary malignant neoplasm of bone: Secondary | ICD-10-CM | POA: Diagnosis not present

## 2022-02-20 DIAGNOSIS — C7951 Secondary malignant neoplasm of bone: Secondary | ICD-10-CM | POA: Diagnosis not present

## 2022-03-14 DIAGNOSIS — R7309 Other abnormal glucose: Secondary | ICD-10-CM | POA: Diagnosis not present

## 2022-03-14 DIAGNOSIS — E785 Hyperlipidemia, unspecified: Secondary | ICD-10-CM | POA: Diagnosis not present

## 2022-03-14 DIAGNOSIS — E559 Vitamin D deficiency, unspecified: Secondary | ICD-10-CM | POA: Diagnosis not present

## 2022-03-16 DIAGNOSIS — Z961 Presence of intraocular lens: Secondary | ICD-10-CM | POA: Diagnosis not present

## 2022-03-16 DIAGNOSIS — H43813 Vitreous degeneration, bilateral: Secondary | ICD-10-CM | POA: Diagnosis not present

## 2022-03-16 DIAGNOSIS — H35363 Drusen (degenerative) of macula, bilateral: Secondary | ICD-10-CM | POA: Diagnosis not present

## 2022-03-16 DIAGNOSIS — H353111 Nonexudative age-related macular degeneration, right eye, early dry stage: Secondary | ICD-10-CM | POA: Diagnosis not present

## 2022-03-16 DIAGNOSIS — H5315 Visual distortions of shape and size: Secondary | ICD-10-CM | POA: Diagnosis not present

## 2022-03-16 DIAGNOSIS — H353122 Nonexudative age-related macular degeneration, left eye, intermediate dry stage: Secondary | ICD-10-CM | POA: Diagnosis not present

## 2022-03-16 DIAGNOSIS — H35371 Puckering of macula, right eye: Secondary | ICD-10-CM | POA: Diagnosis not present

## 2022-03-16 DIAGNOSIS — H35453 Secondary pigmentary degeneration, bilateral: Secondary | ICD-10-CM | POA: Diagnosis not present

## 2022-03-21 DIAGNOSIS — N1831 Chronic kidney disease, stage 3a: Secondary | ICD-10-CM | POA: Diagnosis not present

## 2022-03-21 DIAGNOSIS — E785 Hyperlipidemia, unspecified: Secondary | ICD-10-CM | POA: Diagnosis not present

## 2022-03-21 DIAGNOSIS — E559 Vitamin D deficiency, unspecified: Secondary | ICD-10-CM | POA: Diagnosis not present

## 2022-03-21 DIAGNOSIS — C7951 Secondary malignant neoplasm of bone: Secondary | ICD-10-CM | POA: Diagnosis not present

## 2022-03-21 DIAGNOSIS — I10 Essential (primary) hypertension: Secondary | ICD-10-CM | POA: Diagnosis not present

## 2022-03-21 DIAGNOSIS — G629 Polyneuropathy, unspecified: Secondary | ICD-10-CM | POA: Diagnosis not present

## 2022-03-21 DIAGNOSIS — D649 Anemia, unspecified: Secondary | ICD-10-CM | POA: Diagnosis not present

## 2022-03-21 DIAGNOSIS — R7309 Other abnormal glucose: Secondary | ICD-10-CM | POA: Diagnosis not present

## 2022-03-22 DIAGNOSIS — C7951 Secondary malignant neoplasm of bone: Secondary | ICD-10-CM | POA: Diagnosis not present

## 2022-04-24 DIAGNOSIS — C7951 Secondary malignant neoplasm of bone: Secondary | ICD-10-CM | POA: Diagnosis not present

## 2022-05-10 DIAGNOSIS — C7951 Secondary malignant neoplasm of bone: Secondary | ICD-10-CM | POA: Diagnosis not present

## 2022-06-14 DIAGNOSIS — C7951 Secondary malignant neoplasm of bone: Secondary | ICD-10-CM | POA: Diagnosis not present

## 2022-07-13 DIAGNOSIS — H35453 Secondary pigmentary degeneration, bilateral: Secondary | ICD-10-CM | POA: Diagnosis not present

## 2022-07-13 DIAGNOSIS — H35722 Serous detachment of retinal pigment epithelium, left eye: Secondary | ICD-10-CM | POA: Diagnosis not present

## 2022-07-13 DIAGNOSIS — Z961 Presence of intraocular lens: Secondary | ICD-10-CM | POA: Diagnosis not present

## 2022-07-13 DIAGNOSIS — H35363 Drusen (degenerative) of macula, bilateral: Secondary | ICD-10-CM | POA: Diagnosis not present

## 2022-07-13 DIAGNOSIS — H353111 Nonexudative age-related macular degeneration, right eye, early dry stage: Secondary | ICD-10-CM | POA: Diagnosis not present

## 2022-07-13 DIAGNOSIS — H353122 Nonexudative age-related macular degeneration, left eye, intermediate dry stage: Secondary | ICD-10-CM | POA: Diagnosis not present

## 2022-07-18 DIAGNOSIS — C7951 Secondary malignant neoplasm of bone: Secondary | ICD-10-CM | POA: Diagnosis not present

## 2022-07-26 DIAGNOSIS — H353132 Nonexudative age-related macular degeneration, bilateral, intermediate dry stage: Secondary | ICD-10-CM | POA: Diagnosis not present

## 2022-07-26 DIAGNOSIS — H43813 Vitreous degeneration, bilateral: Secondary | ICD-10-CM | POA: Diagnosis not present

## 2022-07-26 DIAGNOSIS — H52203 Unspecified astigmatism, bilateral: Secondary | ICD-10-CM | POA: Diagnosis not present

## 2022-07-26 DIAGNOSIS — H524 Presbyopia: Secondary | ICD-10-CM | POA: Diagnosis not present

## 2022-08-02 DIAGNOSIS — C7951 Secondary malignant neoplasm of bone: Secondary | ICD-10-CM | POA: Diagnosis not present

## 2022-08-03 DIAGNOSIS — L853 Xerosis cutis: Secondary | ICD-10-CM | POA: Diagnosis not present

## 2022-08-03 DIAGNOSIS — D225 Melanocytic nevi of trunk: Secondary | ICD-10-CM | POA: Diagnosis not present

## 2022-08-03 DIAGNOSIS — Z85820 Personal history of malignant melanoma of skin: Secondary | ICD-10-CM | POA: Diagnosis not present

## 2022-08-03 DIAGNOSIS — D1801 Hemangioma of skin and subcutaneous tissue: Secondary | ICD-10-CM | POA: Diagnosis not present

## 2022-08-03 DIAGNOSIS — L821 Other seborrheic keratosis: Secondary | ICD-10-CM | POA: Diagnosis not present

## 2022-08-17 DIAGNOSIS — C7951 Secondary malignant neoplasm of bone: Secondary | ICD-10-CM | POA: Diagnosis not present

## 2022-09-19 DIAGNOSIS — C7951 Secondary malignant neoplasm of bone: Secondary | ICD-10-CM | POA: Diagnosis not present

## 2022-09-22 DIAGNOSIS — D649 Anemia, unspecified: Secondary | ICD-10-CM | POA: Diagnosis not present

## 2022-09-22 DIAGNOSIS — E785 Hyperlipidemia, unspecified: Secondary | ICD-10-CM | POA: Diagnosis not present

## 2022-09-22 DIAGNOSIS — Z0000 Encounter for general adult medical examination without abnormal findings: Secondary | ICD-10-CM | POA: Diagnosis not present

## 2022-09-22 DIAGNOSIS — R7309 Other abnormal glucose: Secondary | ICD-10-CM | POA: Diagnosis not present

## 2022-09-22 DIAGNOSIS — I10 Essential (primary) hypertension: Secondary | ICD-10-CM | POA: Diagnosis not present

## 2022-10-02 ENCOUNTER — Other Ambulatory Visit: Payer: Self-pay | Admitting: Urology

## 2022-10-02 DIAGNOSIS — C7951 Secondary malignant neoplasm of bone: Secondary | ICD-10-CM

## 2022-10-02 DIAGNOSIS — C61 Malignant neoplasm of prostate: Secondary | ICD-10-CM

## 2022-10-04 DIAGNOSIS — N1831 Chronic kidney disease, stage 3a: Secondary | ICD-10-CM | POA: Diagnosis not present

## 2022-10-04 DIAGNOSIS — E559 Vitamin D deficiency, unspecified: Secondary | ICD-10-CM | POA: Diagnosis not present

## 2022-10-04 DIAGNOSIS — E785 Hyperlipidemia, unspecified: Secondary | ICD-10-CM | POA: Diagnosis not present

## 2022-10-04 DIAGNOSIS — G629 Polyneuropathy, unspecified: Secondary | ICD-10-CM | POA: Diagnosis not present

## 2022-10-04 DIAGNOSIS — Z0000 Encounter for general adult medical examination without abnormal findings: Secondary | ICD-10-CM | POA: Diagnosis not present

## 2022-10-04 DIAGNOSIS — R7309 Other abnormal glucose: Secondary | ICD-10-CM | POA: Diagnosis not present

## 2022-10-04 DIAGNOSIS — C7951 Secondary malignant neoplasm of bone: Secondary | ICD-10-CM | POA: Diagnosis not present

## 2022-10-04 DIAGNOSIS — D649 Anemia, unspecified: Secondary | ICD-10-CM | POA: Diagnosis not present

## 2022-10-04 DIAGNOSIS — I10 Essential (primary) hypertension: Secondary | ICD-10-CM | POA: Diagnosis not present

## 2022-10-04 DIAGNOSIS — M7989 Other specified soft tissue disorders: Secondary | ICD-10-CM | POA: Diagnosis not present

## 2022-10-16 ENCOUNTER — Encounter (HOSPITAL_COMMUNITY)
Admission: RE | Admit: 2022-10-16 | Discharge: 2022-10-16 | Disposition: A | Discharge status: Home / Self Care | Payer: Medicare Other | Source: Ambulatory Visit | Attending: Urology | Admitting: Urology

## 2022-10-16 DIAGNOSIS — C7951 Secondary malignant neoplasm of bone: Secondary | ICD-10-CM | POA: Diagnosis not present

## 2022-10-16 DIAGNOSIS — C7952 Secondary malignant neoplasm of bone marrow: Secondary | ICD-10-CM | POA: Insufficient documentation

## 2022-10-16 DIAGNOSIS — C61 Malignant neoplasm of prostate: Secondary | ICD-10-CM | POA: Insufficient documentation

## 2022-10-16 MED ORDER — TECHNETIUM TC 99M MEDRONATE IV KIT
20.0000 | PACK | Freq: Once | INTRAVENOUS | Status: AC | PRN
  Administered 2022-10-16: 21.2 via INTRAVENOUS

## 2022-10-25 DIAGNOSIS — C7951 Secondary malignant neoplasm of bone: Secondary | ICD-10-CM | POA: Diagnosis not present

## 2022-11-01 DIAGNOSIS — C7951 Secondary malignant neoplasm of bone: Secondary | ICD-10-CM | POA: Diagnosis not present

## 2022-11-22 DIAGNOSIS — H353131 Nonexudative age-related macular degeneration, bilateral, early dry stage: Secondary | ICD-10-CM | POA: Diagnosis not present

## 2022-11-22 DIAGNOSIS — H35722 Serous detachment of retinal pigment epithelium, left eye: Secondary | ICD-10-CM | POA: Diagnosis not present

## 2022-11-22 DIAGNOSIS — Z961 Presence of intraocular lens: Secondary | ICD-10-CM | POA: Diagnosis not present

## 2022-11-22 DIAGNOSIS — H35371 Puckering of macula, right eye: Secondary | ICD-10-CM | POA: Diagnosis not present

## 2022-11-22 DIAGNOSIS — H43392 Other vitreous opacities, left eye: Secondary | ICD-10-CM | POA: Diagnosis not present

## 2022-11-22 DIAGNOSIS — H35363 Drusen (degenerative) of macula, bilateral: Secondary | ICD-10-CM | POA: Diagnosis not present

## 2022-11-22 DIAGNOSIS — H43812 Vitreous degeneration, left eye: Secondary | ICD-10-CM | POA: Diagnosis not present

## 2022-11-29 DIAGNOSIS — C7951 Secondary malignant neoplasm of bone: Secondary | ICD-10-CM | POA: Diagnosis not present

## 2023-01-01 DIAGNOSIS — C7951 Secondary malignant neoplasm of bone: Secondary | ICD-10-CM | POA: Diagnosis not present

## 2023-02-01 DIAGNOSIS — C7951 Secondary malignant neoplasm of bone: Secondary | ICD-10-CM | POA: Diagnosis not present

## 2023-02-13 DIAGNOSIS — C778 Secondary and unspecified malignant neoplasm of lymph nodes of multiple regions: Secondary | ICD-10-CM | POA: Diagnosis not present

## 2023-02-13 DIAGNOSIS — C7951 Secondary malignant neoplasm of bone: Secondary | ICD-10-CM | POA: Diagnosis not present

## 2023-03-05 DIAGNOSIS — C44629 Squamous cell carcinoma of skin of left upper limb, including shoulder: Secondary | ICD-10-CM | POA: Diagnosis not present

## 2023-03-07 DIAGNOSIS — C7951 Secondary malignant neoplasm of bone: Secondary | ICD-10-CM | POA: Diagnosis not present

## 2023-03-29 DIAGNOSIS — R7309 Other abnormal glucose: Secondary | ICD-10-CM | POA: Diagnosis not present

## 2023-03-29 DIAGNOSIS — E785 Hyperlipidemia, unspecified: Secondary | ICD-10-CM | POA: Diagnosis not present

## 2023-03-29 DIAGNOSIS — D649 Anemia, unspecified: Secondary | ICD-10-CM | POA: Diagnosis not present

## 2023-03-29 DIAGNOSIS — E559 Vitamin D deficiency, unspecified: Secondary | ICD-10-CM | POA: Diagnosis not present

## 2023-04-05 DIAGNOSIS — N183 Chronic kidney disease, stage 3 unspecified: Secondary | ICD-10-CM | POA: Diagnosis not present

## 2023-04-05 DIAGNOSIS — C7951 Secondary malignant neoplasm of bone: Secondary | ICD-10-CM | POA: Diagnosis not present

## 2023-04-05 DIAGNOSIS — D649 Anemia, unspecified: Secondary | ICD-10-CM | POA: Diagnosis not present

## 2023-04-05 DIAGNOSIS — R7309 Other abnormal glucose: Secondary | ICD-10-CM | POA: Diagnosis not present

## 2023-04-05 DIAGNOSIS — E559 Vitamin D deficiency, unspecified: Secondary | ICD-10-CM | POA: Diagnosis not present

## 2023-04-05 DIAGNOSIS — I10 Essential (primary) hypertension: Secondary | ICD-10-CM | POA: Diagnosis not present

## 2023-04-05 DIAGNOSIS — G629 Polyneuropathy, unspecified: Secondary | ICD-10-CM | POA: Diagnosis not present

## 2023-04-05 DIAGNOSIS — M7989 Other specified soft tissue disorders: Secondary | ICD-10-CM | POA: Diagnosis not present

## 2023-04-05 DIAGNOSIS — E785 Hyperlipidemia, unspecified: Secondary | ICD-10-CM | POA: Diagnosis not present

## 2023-04-09 DIAGNOSIS — H35363 Drusen (degenerative) of macula, bilateral: Secondary | ICD-10-CM | POA: Diagnosis not present

## 2023-04-09 DIAGNOSIS — H35371 Puckering of macula, right eye: Secondary | ICD-10-CM | POA: Diagnosis not present

## 2023-04-09 DIAGNOSIS — H35722 Serous detachment of retinal pigment epithelium, left eye: Secondary | ICD-10-CM | POA: Diagnosis not present

## 2023-04-09 DIAGNOSIS — H353131 Nonexudative age-related macular degeneration, bilateral, early dry stage: Secondary | ICD-10-CM | POA: Diagnosis not present

## 2023-04-10 DIAGNOSIS — C7951 Secondary malignant neoplasm of bone: Secondary | ICD-10-CM | POA: Diagnosis not present

## 2023-05-16 DIAGNOSIS — C7951 Secondary malignant neoplasm of bone: Secondary | ICD-10-CM | POA: Diagnosis not present

## 2023-05-16 DIAGNOSIS — C778 Secondary and unspecified malignant neoplasm of lymph nodes of multiple regions: Secondary | ICD-10-CM | POA: Diagnosis not present

## 2023-06-20 DIAGNOSIS — C7951 Secondary malignant neoplasm of bone: Secondary | ICD-10-CM | POA: Diagnosis not present

## 2023-07-18 DIAGNOSIS — C7951 Secondary malignant neoplasm of bone: Secondary | ICD-10-CM | POA: Diagnosis not present

## 2023-07-27 DIAGNOSIS — H353131 Nonexudative age-related macular degeneration, bilateral, early dry stage: Secondary | ICD-10-CM | POA: Diagnosis not present

## 2023-07-27 DIAGNOSIS — Z961 Presence of intraocular lens: Secondary | ICD-10-CM | POA: Diagnosis not present

## 2023-08-09 DIAGNOSIS — I739 Peripheral vascular disease, unspecified: Secondary | ICD-10-CM | POA: Diagnosis not present

## 2023-08-09 DIAGNOSIS — R252 Cramp and spasm: Secondary | ICD-10-CM | POA: Diagnosis not present

## 2023-08-10 DIAGNOSIS — I739 Peripheral vascular disease, unspecified: Secondary | ICD-10-CM | POA: Diagnosis not present

## 2023-08-15 ENCOUNTER — Encounter: Payer: Self-pay | Admitting: Vascular Surgery

## 2023-08-15 ENCOUNTER — Ambulatory Visit: Attending: Vascular Surgery | Admitting: Vascular Surgery

## 2023-08-15 VITALS — BP 117/78 | HR 74 | Temp 97.9°F | Ht 73.0 in | Wt 174.0 lb

## 2023-08-15 DIAGNOSIS — M25562 Pain in left knee: Secondary | ICD-10-CM

## 2023-08-15 DIAGNOSIS — M25561 Pain in right knee: Secondary | ICD-10-CM | POA: Diagnosis not present

## 2023-08-15 NOTE — Progress Notes (Signed)
 Patient ID: Lucas Navarro, male   DOB: 1936/01/18, 88 y.o.   MRN: [id number]  Reason for Consult: New Patient (Initial Visit)   Referred by Lucas Heritage, NP  Subjective:     HPI:  Lucas Navarro is a 88 y.o. male without significant disease.  He states that for now approximately 1 month he has had pain in his bilateral leg particularly when he stands and is still.  Left is always worse right.  If he is at rest or even when he is walking does not have any pain.  He has had occasional pains in the middle of the night.  He denies any tissue loss or ulceration.  No previous history of vascular intervention.  He is unaware of any personal or family history of aneurysm disease and denies any history of stroke, TIA or amaurosis.  Past Medical History:  Diagnosis Date   Cancer ([facility]) 2010   hx malignant melanoma of the skin left temple excised DR.Drusilla Gerlach   ED (erectile dysfunction)    History of radiation therapy 11/15/11-01/12/12   prostate 7800cGy seminal vesicles 5600cGy/40 sessions   Macular degeneration    Prolapsing mitral valve    hx prolapsing mitral valve leaflet syndrome   Prostate CA ([facility]) 12/2007   dx Adenocarcinoma   Recurrent prostate adenocarcinoma ([facility]) 09/07/11   bx= recurrentAdenocarcinoma,PSA=7.30,gleason=4+3=7,&3+4=7,volume=33.40cc   Skin cancer    Family History  Problem Relation Age of Onset   Cancer Mother        fibroid   Cancer Father        prostate   Past Surgical History:  Procedure Laterality Date   CATARACT EXTRACTION     bilateral    colonscopy     NASAL ENDOSCOPY WITH EPISTAXIS CONTROL Left 06/13/2017   Procedure: NASAL EXAM WITH ENDOSCOPIC CAUTERIZATION LEFT SIDED  EPISTAXIS;  Surgeon: Prescott Brodie, MD;  Location: [facility];  Service: ENT;  Laterality: Left;   TONSILLECTOMY      Short Social History:  Social History   Tobacco Use   Smoking status: Never   Smokeless tobacco: Never  Substance Use Topics    Alcohol use: No    Comment: only on occasion socially    No Known Allergies  Current Outpatient Medications  Medication Sig Dispense Refill   aspirin 81 MG tablet Take 81 mg by mouth daily.     Multiple Vitamins-Minerals (EYE VITAMINS) CAPS Take by mouth.     polyethylene glycol (MIRALAX / GLYCOLAX) packet Take 17 g by mouth as needed.     No current facility-administered medications for this visit.    Review of Systems  Constitutional:  Constitutional negative. HENT: HENT negative.  Eyes: Eyes negative.  Cardiovascular: Cardiovascular negative.  GI: Gastrointestinal negative.  Musculoskeletal: Positive for leg pain.  Neurological: Neurological negative. Hematologic: Hematologic/lymphatic negative.  Psychiatric: Psychiatric negative.        Objective:  Objective   Vitals:   08/15/23 0923  BP: 117/78  Pulse: 74  Temp: 97.9 F (36.6 C)  SpO2: 93%     Physical Exam HENT:     Head: Normocephalic.     Nose: Nose normal.  Eyes:     Pupils: Pupils are equal, round, and reactive to light.  Neck:     Vascular: No carotid bruit.  Cardiovascular:     Rate and Rhythm: Normal rate and regular rhythm.     Pulses:          Popliteal pulses are 2+  on the right side and 2+ on the left side.       Posterior tibial pulses are 2+ on the right side and 2+ on the left side.  Pulmonary:     Effort: Pulmonary effort is normal.     Breath sounds: Normal breath sounds.  Abdominal:     General: Abdomen is flat.     Palpations: Abdomen is soft. There is no mass.  Musculoskeletal:        General: Normal range of motion.     Cervical back: Neck supple.     Right lower leg: No edema.     Left lower leg: No edema.  Skin:    Capillary Refill: Capillary refill takes less than 2 seconds.  Neurological:     General: No focal deficit present.     Mental Status: He is alert.  Psychiatric:        Mood and Affect: Mood normal.        Thought Content: Thought content normal.         Judgment: Judgment normal.     Data:      Assessment/Plan:     88 year old here for bilateral lower extremity left greater right pain with normal ABIs and preserved pulses.  Does not appear to have arterial or venous component to his pain at this time and as such can see me on an as-needed basis.     Adine Hoof MD [facility]

## 2023-08-16 DIAGNOSIS — C7951 Secondary malignant neoplasm of bone: Secondary | ICD-10-CM | POA: Diagnosis not present

## 2023-08-23 DIAGNOSIS — L821 Other seborrheic keratosis: Secondary | ICD-10-CM | POA: Diagnosis not present

## 2023-08-23 DIAGNOSIS — L853 Xerosis cutis: Secondary | ICD-10-CM | POA: Diagnosis not present

## 2023-08-23 DIAGNOSIS — L812 Freckles: Secondary | ICD-10-CM | POA: Diagnosis not present

## 2023-08-23 DIAGNOSIS — D1801 Hemangioma of skin and subcutaneous tissue: Secondary | ICD-10-CM | POA: Diagnosis not present

## 2023-08-23 DIAGNOSIS — Z85828 Personal history of other malignant neoplasm of skin: Secondary | ICD-10-CM | POA: Diagnosis not present

## 2023-09-04 DIAGNOSIS — C7951 Secondary malignant neoplasm of bone: Secondary | ICD-10-CM | POA: Diagnosis not present

## 2023-09-19 DIAGNOSIS — C7951 Secondary malignant neoplasm of bone: Secondary | ICD-10-CM | POA: Diagnosis not present

## 2023-10-04 DIAGNOSIS — D649 Anemia, unspecified: Secondary | ICD-10-CM | POA: Diagnosis not present

## 2023-10-04 DIAGNOSIS — E559 Vitamin D deficiency, unspecified: Secondary | ICD-10-CM | POA: Diagnosis not present

## 2023-10-04 DIAGNOSIS — E785 Hyperlipidemia, unspecified: Secondary | ICD-10-CM | POA: Diagnosis not present

## 2023-10-04 DIAGNOSIS — R7309 Other abnormal glucose: Secondary | ICD-10-CM | POA: Diagnosis not present

## 2023-10-10 DIAGNOSIS — H353131 Nonexudative age-related macular degeneration, bilateral, early dry stage: Secondary | ICD-10-CM | POA: Diagnosis not present

## 2023-10-10 DIAGNOSIS — H35363 Drusen (degenerative) of macula, bilateral: Secondary | ICD-10-CM | POA: Diagnosis not present

## 2023-10-10 DIAGNOSIS — H35722 Serous detachment of retinal pigment epithelium, left eye: Secondary | ICD-10-CM | POA: Diagnosis not present

## 2023-10-10 DIAGNOSIS — H35371 Puckering of macula, right eye: Secondary | ICD-10-CM | POA: Diagnosis not present

## 2023-10-11 DIAGNOSIS — D649 Anemia, unspecified: Secondary | ICD-10-CM | POA: Diagnosis not present

## 2023-10-11 DIAGNOSIS — R7309 Other abnormal glucose: Secondary | ICD-10-CM | POA: Diagnosis not present

## 2023-10-11 DIAGNOSIS — C7951 Secondary malignant neoplasm of bone: Secondary | ICD-10-CM | POA: Diagnosis not present

## 2023-10-11 DIAGNOSIS — E559 Vitamin D deficiency, unspecified: Secondary | ICD-10-CM | POA: Diagnosis not present

## 2023-10-11 DIAGNOSIS — E785 Hyperlipidemia, unspecified: Secondary | ICD-10-CM | POA: Diagnosis not present

## 2023-10-11 DIAGNOSIS — I10 Essential (primary) hypertension: Secondary | ICD-10-CM | POA: Diagnosis not present

## 2023-10-11 DIAGNOSIS — G629 Polyneuropathy, unspecified: Secondary | ICD-10-CM | POA: Diagnosis not present

## 2023-10-11 DIAGNOSIS — Z0000 Encounter for general adult medical examination without abnormal findings: Secondary | ICD-10-CM | POA: Diagnosis not present

## 2023-10-11 DIAGNOSIS — N183 Chronic kidney disease, stage 3 unspecified: Secondary | ICD-10-CM | POA: Diagnosis not present

## 2023-10-24 DIAGNOSIS — C7951 Secondary malignant neoplasm of bone: Secondary | ICD-10-CM | POA: Diagnosis not present

## 2023-11-20 DIAGNOSIS — K4090 Unilateral inguinal hernia, without obstruction or gangrene, not specified as recurrent: Secondary | ICD-10-CM | POA: Diagnosis not present

## 2023-11-20 DIAGNOSIS — C7951 Secondary malignant neoplasm of bone: Secondary | ICD-10-CM | POA: Diagnosis not present

## 2023-11-23 DIAGNOSIS — C7951 Secondary malignant neoplasm of bone: Secondary | ICD-10-CM | POA: Diagnosis not present

## 2023-12-06 DIAGNOSIS — C7951 Secondary malignant neoplasm of bone: Secondary | ICD-10-CM | POA: Diagnosis not present

## 2023-12-06 DIAGNOSIS — C778 Secondary and unspecified malignant neoplasm of lymph nodes of multiple regions: Secondary | ICD-10-CM | POA: Diagnosis not present

## 2023-12-27 DIAGNOSIS — C7951 Secondary malignant neoplasm of bone: Secondary | ICD-10-CM | POA: Diagnosis not present

## 2024-01-10 DIAGNOSIS — L821 Other seborrheic keratosis: Secondary | ICD-10-CM | POA: Diagnosis not present

## 2024-01-10 DIAGNOSIS — L820 Inflamed seborrheic keratosis: Secondary | ICD-10-CM | POA: Diagnosis not present

## 2024-01-10 DIAGNOSIS — Z85828 Personal history of other malignant neoplasm of skin: Secondary | ICD-10-CM | POA: Diagnosis not present

## 2024-01-28 DIAGNOSIS — C7951 Secondary malignant neoplasm of bone: Secondary | ICD-10-CM | POA: Diagnosis not present
